# Patient Record
Sex: Female | Born: 1990
Health system: Southern US, Community
[De-identification: ages and names within clinical notes are randomized; demographics above are authoritative.]

## PROBLEM LIST (undated history)

## (undated) DIAGNOSIS — F32A Depression, unspecified: Secondary | ICD-10-CM

## (undated) DIAGNOSIS — I370 Nonrheumatic pulmonary valve stenosis: Secondary | ICD-10-CM

## (undated) DIAGNOSIS — F419 Anxiety disorder, unspecified: Secondary | ICD-10-CM

## (undated) HISTORY — DX: Nonrheumatic pulmonary valve stenosis: I37.0

## (undated) HISTORY — PX: CHOLECYSTECTOMY: SHX55

---

## 2017-01-18 ENCOUNTER — Ambulatory Visit (INDEPENDENT_AMBULATORY_CARE_PROVIDER_SITE_OTHER): Payer: BLUE CROSS/BLUE SHIELD | Admitting: Osteopathic Medicine

## 2017-01-18 ENCOUNTER — Encounter: Payer: Self-pay | Admitting: Osteopathic Medicine

## 2017-01-18 VITALS — BP 124/78 | HR 59 | Ht 66.0 in | Wt 207.0 lb

## 2017-01-18 DIAGNOSIS — F40241 Acrophobia: Secondary | ICD-10-CM | POA: Insufficient documentation

## 2017-01-18 DIAGNOSIS — Z3041 Encounter for surveillance of contraceptive pills: Secondary | ICD-10-CM | POA: Diagnosis not present

## 2017-01-18 DIAGNOSIS — I370 Nonrheumatic pulmonary valve stenosis: Secondary | ICD-10-CM | POA: Insufficient documentation

## 2017-01-18 DIAGNOSIS — Z113 Encounter for screening for infections with a predominantly sexual mode of transmission: Secondary | ICD-10-CM

## 2017-01-18 NOTE — Progress Notes (Signed)
HPI: Alisha Rogers is a 26 y.o. female  who presents to [facility] today, 01/18/17,  for chief complaint of:  Chief Complaint  Patient presents with  . 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 She is afraid of heights and had a panic attack at work, requesting accommodations  Skin lesions: several moles which had been bothering her, would like these checked out. Irritating around the bra strap areas.  Hx pulmonary valve stenosis - diagnosed in childhood, no cardiac problems or exercise intolerance as a result.   Would like routine STI screening and refill on birth control.     Past medical, surgical, social and family history reviewed: Patient Active Problem List   Diagnosis Date Noted  . Acrophobia 01/18/2017  . Surveillance for birth control, oral contraceptives 01/18/2017  . Pulmonary stenosis 01/18/2017   No past surgical history on file. Social History  Substance Use Topics  . Smoking status: Never Smoker  . Smokeless tobacco: Never Used  . Alcohol use Not on file   Family History  Problem Relation Age of Onset  . Diabetes Father   . Stroke Maternal Grandmother   . Heart attack Maternal Grandfather      Current medication list and allergy/intolerance information reviewed:   Current Outpatient Prescriptions  Medication Sig Dispense Refill  . drospirenone-ethinyl estradiol (YASMIN,ZARAH,SYEDA) 3-0.03 MG tablet Take 1 tablet by mouth daily.     No current facility-administered medications for this visit.    No Known Allergies    Review of Systems:  Constitutional:  No  fever, no chills, No recent illness, No unintentional weight changes. No significant fatigue.   HEENT: No  headache, no vision change, no hearing change, No sore throat, No  sinus pressure  Cardiac: No  chest pain, No  pressure, No palpitations, No   Orthopnea  Respiratory:  No  shortness of breath. No  Cough  Gastrointestinal: No  abdominal pain, No  nausea, No  vomiting,  No  blood in stool, No  diarrhea, No  constipation   Musculoskeletal: No new myalgia/arthralgia  Genitourinary: No  incontinence, No  abnormal genital bleeding, No abnormal genital discharge  Skin: No  Rash, +other wounds/concerning lesions  Hem/Onc: No  easy bruising/bleeding, No  abnormal lymph node  Endocrine: No cold intolerance,  No heat intolerance.  Neurologic: No  weakness, No  dizziness  Psychiatric: No  concerns with depression, No  concerns with anxiety, No sleep problems, No mood problems  Exam:  BP 124/78   Pulse (!) 59   Ht 5\' 6"  (1.676 m)   Wt 207 lb (93.9 kg)   BMI 33.41 kg/m   Constitutional: VS see above. General Appearance: alert, well-developed, well-nourished, NAD  Eyes: Normal lids and conjunctive, non-icteric sclera  Ears, Nose, Mouth, Throat: MMM, Normal external inspection ears/nares/mouth/lips/gums.  Neck: No masses, trachea midline. No thyroid enlargement. No tenderness/mass appreciated. No lymphadenopathy  Respiratory: Normal respiratory effort. no wheeze, no rhonchi, no rales  Cardiovascular: S1/S2 normal, no murmur, no rub/gallop auscultated. RRR. No lower extremity edema.   Musculoskeletal: Gait normal.  Neurological: Normal balance/coordination. No tremor.   Skin: warm, dry, intact. No rash/ulcer. No concerning nevi or subq nodules on limited exam though multiple benign appearing moles on trunk    Psychiatric: Normal judgment/insight. Normal mood and affect. Oriented x3.    ASSESSMENT/PLAN:   Acrophobia - paperwork completed  for work [employer] for birth control, oral contraceptives - Plan: CBC, COMPLETE METABOLIC PANEL WITH GFR  Routine screening for STI (sexually transmitted infection) - Plan: HIV antibody  Pulmonary valve stenosis, unspecified etiology     Visit summary with  medication list and pertinent instructions was printed for patient to review. All questions at time of visit were answered - patient instructed to contact office with any additional concerns. ER/RTC precautions were reviewed with the patient. Follow-up plan: Return for 1) skin tag removal; 2) annual wellness exam and Pap .  Note: Total time spent 30 minutes, greater than 50% of the visit was spent face-to-face counseling and coordinating care for the following: The primary encounter diagnosis was Acrophobia. Diagnoses of Surveillance for birth control, oral contraceptives, Routine screening for STI (sexually transmitted infection), and Pulmonary valve stenosis, unspecified etiology were also pertinent to this visit.Marland Kitchen

## 2017-01-19 LAB — CBC
HCT: 34.8 % — ABNORMAL LOW (ref 35.0–45.0)
HEMOGLOBIN: 11.8 g/dL (ref 11.7–15.5)
MCH: 28.2 pg (ref 27.0–33.0)
MCHC: 33.9 g/dL (ref 32.0–36.0)
MCV: 83.1 fL (ref 80.0–100.0)
MPV: 10.3 fL (ref 7.5–12.5)
PLATELETS: 266 10*3/uL (ref 140–400)
RBC: 4.19 10*6/uL (ref 3.80–5.10)
RDW: 12.6 % (ref 11.0–15.0)
WBC: 6.9 10*3/uL (ref 3.8–10.8)

## 2017-01-19 LAB — COMPLETE METABOLIC PANEL WITH GFR
AG RATIO: 1.3 (calc) (ref 1.0–2.5)
ALBUMIN MSPROF: 4 g/dL (ref 3.6–5.1)
ALT: 15 U/L (ref 6–29)
AST: 14 U/L (ref 10–30)
Alkaline phosphatase (APISO): 58 U/L (ref 33–115)
BUN / CREAT RATIO: 8 (calc) (ref 6–22)
BUN: 6 mg/dL — AB (ref 7–25)
CALCIUM: 9.4 mg/dL (ref 8.6–10.2)
CO2: 24 mmol/L (ref 20–32)
Chloride: 106 mmol/L (ref 98–110)
Creat: 0.75 mg/dL (ref 0.50–1.10)
GFR, EST AFRICAN AMERICAN: 127 mL/min/{1.73_m2} (ref >=60)
GFR, EST NON AFRICAN AMERICAN: 109 mL/min/{1.73_m2} (ref >=60)
GLUCOSE: 97 mg/dL (ref 65–99)
Globulin: 3 g/dL (calc) (ref 1.9–3.7)
Potassium: 4.4 mmol/L (ref 3.5–5.3)
Sodium: 139 mmol/L (ref 135–146)
Total Bilirubin: 0.4 mg/dL (ref 0.2–1.2)
Total Protein: 7 g/dL (ref 6.1–8.1)

## 2017-01-19 LAB — HIV ANTIBODY (ROUTINE TESTING W REFLEX): HIV 1&2 Ab, 4th Generation: NONREACTIVE

## 2017-01-25 ENCOUNTER — Encounter: Payer: Self-pay | Admitting: Osteopathic Medicine

## 2017-01-25 ENCOUNTER — Ambulatory Visit (INDEPENDENT_AMBULATORY_CARE_PROVIDER_SITE_OTHER): Payer: BLUE CROSS/BLUE SHIELD | Admitting: Osteopathic Medicine

## 2017-01-25 VITALS — BP 138/82 | HR 71 | Wt 204.0 lb

## 2017-01-25 DIAGNOSIS — D229 Melanocytic nevi, unspecified: Secondary | ICD-10-CM

## 2017-01-26 NOTE — Progress Notes (Signed)
HPI: Alisha Rogers is a 26 y.o. female  who presents to [facility] today, 01/26/17,  for chief complaint of:  Chief Complaint  Patient presents with  . SKIN TAG REMOVAL    Irritation of skin tags on back and left axilla. Especially at areas near the bra line, shirt collar. Here today for removal.  Past medical history, surgical history, social history and family history reviewed.  Patient Active Problem List   Diagnosis Date Noted  . Acrophobia 01/18/2017  . Surveillance for birth control, oral contraceptives 01/18/2017  . Pulmonary stenosis 01/18/2017    Current medication list and allergy/intolerance information reviewed.   Current Outpatient Prescriptions on File Prior to Visit  Medication Sig Dispense Refill  . drospirenone-ethinyl estradiol (YASMIN,ZARAH,SYEDA) 3-0.03 MG tablet Take 1 tablet by mouth daily.     No current facility-administered medications on file prior to visit.    No Known Allergies    Review of Systems:  Constitutional: No recent illness, feels well today   Exam:  BP 138/82   Pulse 71   Wt 204 lb (92.5 kg)   BMI 32.93 kg/m   Constitutional: VS see above. General Appearance: alert, well-developed, well-nourished, NAD  Skin: warm, dry, intact. Benign moles - multiple    PRE-OP DIAGNOSIS: Irritated benign-appearing Skin Lesions POST-OP DIAGNOSIS: Same  PROCEDURE: skin biopsy Performing Physician: Emeterio Reeve   PROCEDURE:  Shave Biopsy and skin tag removal - given fairly broad base of some benign nevi/skin tags opted for shave biopsy of 3 of the 4 lesions   The areas surrounding the skin lesions prepared in the usual sterile manner. Anesthesia obtained with about 2 mL total of lidocaine with epinephrine. Due to unpleasant burning sensation of the anesthesia, patient declined to have skin tags/moles removed in armpit area. Benign-appearing moles removed from base of neck, right shoulder 2, left  shoulder 1. The lesions was removed in the usual manner by the biopsy method noted above. Hemostasis was assured. The patient tolerated the procedure well.   Closure: Silver nitrate cautery for hemostasis   Followup: The patient tolerated the procedure well without complications.  Standard post-procedure care is explained and return precautions are given.    ASSESSMENT/PLAN:   [place] - Plan: Dermatology pathology, Dermatology pathology    Follow-up plan: No Follow-up on file.  Visit summary with medication list and pertinent instructions was printed for patient to review, alert [place] if any changes needed. All questions at time of visit were answered - patient instructed to contact office with any additional concerns. ER/RTC precautions were reviewed with the patient and understanding verbalized.

## 2017-02-01 ENCOUNTER — Ambulatory Visit (INDEPENDENT_AMBULATORY_CARE_PROVIDER_SITE_OTHER): Payer: BLUE CROSS/BLUE SHIELD | Admitting: Osteopathic Medicine

## 2017-02-01 ENCOUNTER — Other Ambulatory Visit (HOSPITAL_COMMUNITY)
Admission: RE | Admit: 2017-02-01 | Discharge: 2017-02-01 | Disposition: A | Discharge status: Home / Self Care | Payer: BLUE CROSS/BLUE SHIELD | Source: Ambulatory Visit | Attending: Family Medicine | Admitting: Family Medicine

## 2017-02-01 ENCOUNTER — Encounter: Payer: Self-pay | Admitting: Osteopathic Medicine

## 2017-02-01 VITALS — BP 127/80 | HR 76 | Ht 66.0 in | Wt 206.0 lb

## 2017-02-01 DIAGNOSIS — H6123 Impacted cerumen, bilateral: Secondary | ICD-10-CM

## 2017-02-01 DIAGNOSIS — Z0000 Encounter for general adult medical examination without abnormal findings: Secondary | ICD-10-CM | POA: Insufficient documentation

## 2017-02-01 DIAGNOSIS — Z113 Encounter for screening for infections with a predominantly sexual mode of transmission: Secondary | ICD-10-CM

## 2017-02-01 DIAGNOSIS — Z124 Encounter for screening for malignant neoplasm of cervix: Secondary | ICD-10-CM | POA: Diagnosis present

## 2017-02-01 DIAGNOSIS — F418 Other specified anxiety disorders: Secondary | ICD-10-CM

## 2017-02-01 MED ORDER — ALPRAZOLAM 0.5 MG PO TBDP: 0.5000 mg | ORAL_TABLET | Freq: Two times a day (BID) | ORAL | 0 refills | Status: DC | PRN

## 2017-02-01 NOTE — Patient Instructions (Signed)
Plan:  You should hear back about the results of your Pap smear within 1 week. Please call us if you do not hear anything back from [place] about this as well as the screening for gonorrhea/chlamydia.  If you decide you would like the other skin tags removed, please schedule an appointment.  If everything is going well, see me in one year for repeat annual physical or sooner if needed if any issues or questions arise!  Take care!

## 2017-02-01 NOTE — Progress Notes (Signed)
HPI: Alisha Rogers is a 26 y.o. female  who presents to [facility] today, 02/01/17,  for chief complaint of:  Chief Complaint  Patient presents with  . Annual Exam  . Gynecologic Exam      Patient here for annual physical / wellness exam.  See preventive care reviewed as below.  Recent labs reviewed in detail with the patient.   Additional concerns today include:   Fear of plane ride, requesting antianxiety medication for upcoming trip.  Earwax bothers her from time to time, requests that we could flush ears today if able  Past medical, surgical, social and family history reviewed: Patient Active Problem List   Diagnosis Date Noted  . Acrophobia 01/18/2017  . Surveillance for birth control, oral contraceptives 01/18/2017  . Pulmonary stenosis 01/18/2017   No past surgical history on file. Social History  Substance Use Topics  . Smoking status: Never Smoker  . Smokeless tobacco: Never Used  . Alcohol use Not on file   Family History  Problem Relation Age of Onset  . Diabetes Father   . Stroke Maternal Grandmother   . Heart attack Maternal Grandfather      Current medication list and allergy/intolerance information reviewed:   Current Outpatient Prescriptions  Medication Sig Dispense Refill  . drospirenone-ethinyl estradiol (YASMIN,ZARAH,SYEDA) 3-0.03 MG tablet Take 1 tablet by mouth daily.     No current facility-administered medications for this visit.    No Known Allergies    Review of Systems:  Constitutional:  No  fever, no chills, No recent illness, No unintentional weight changes. No significant fatigue.   HEENT: No  headache, no vision change, no hearing change, No sore throat, No  sinus pressure  Cardiac: No  chest pain, No  pressure, No palpitations, No  Orthopnea  Respiratory:  No  shortness of breath. No  Cough  Gastrointestinal: No  abdominal pain, No  nausea, No  vomiting,  No  blood in stool, No   diarrhea, No  constipation   Musculoskeletal: No new myalgia/arthralgia  Genitourinary: No  incontinence, No  abnormal genital bleeding, No abnormal genital discharge  Skin: No  Rash, No other wounds/concerning lesions  Hem/Onc: No  easy bruising/bleeding, No  abnormal lymph node  Endocrine: No cold intolerance,  No heat intolerance. No polyuria/polydipsia/polyphagia   Neurologic: No  weakness, No  dizziness, No  slurred speech/focal weakness/facial droop  Psychiatric: No  concerns with depression, No  concerns with anxiety, No sleep problems, No mood problems  Exam:  BP 127/80   Pulse 76   Ht 5\' 6"  (1.676 m)   Wt 206 lb (93.4 kg)   BMI 33.25 kg/m   Constitutional: VS see above. General Appearance: alert, well-developed, well-nourished, NAD  Eyes: Normal lids and conjunctive, non-icteric sclera  Ears, Nose, Mouth, Throat: MMM, Normal external inspection ears/nares/mouth/lips/gums. TM normal bilaterally. Pharynx/tonsils no erythema, no exudate. Nasal mucosa normal.   Neck: No masses, trachea midline. No thyroid enlargement. No tenderness/mass appreciated. No lymphadenopathy  Respiratory: Normal respiratory effort. no wheeze, no rhonchi, no rales  Cardiovascular: S1/S2 normal, no murmur, no rub/gallop auscultated. RRR. No lower extremity edema. Pedal pulse II/IV bilaterally DP and PT. No carotid bruit or JVD. No abdominal aortic bruit.  Gastrointestinal: Nontender, no masses. No hepatomegaly, no splenomegaly. No hernia appreciated. Bowel sounds normal. Rectal exam deferred.   Musculoskeletal: Gait normal. No clubbing/cyanosis of digits.   Neurological: Normal balance/coordination. No tremor. No cranial nerve deficit on limited exam. Motor and sensation intact  and symmetric. Cerebellar reflexes intact.   Skin: warm, dry, intact. No rash/ulcer. No concerning nevi or subq nodules on limited exam.    Psychiatric: Normal judgment/insight. Normal mood and affect. Oriented x3.   GYN: No lesions/ulcers to external genitalia, normal urethra, normal vaginal mucosa, physiologic discharge, cervix normal without lesions, uterus not enlarged or tender, adnexa no masses and nontender  BREAST: No rashes/skin changes, normal fibrous breast tissue, no masses or tenderness, normal nipple without discharge, normal axilla    ASSESSMENT/PLAN:   Annual physical exam - Plan: Cytology - PAP, C. trachomatis/N. gonorrhoeae RNA  Cervical cancer screening - Plan: Cytology - PAP  Situational anxiety - Plan: ALPRAZolam (NIRAVAM) 0.5 MG dissolvable tablet  Routine screening for STI (sexually transmitted infection) - Plan: C. trachomatis/N. gonorrhoeae RNA    FEMALE PREVENTIVE CARE Updated 02/01/17   ANNUAL SCREENING/COUNSELING  Diet/Exercise - HEALTHY HABITS DISCUSSED TO DECREASE CV RISK History  Smoking Status  . Never Smoker  Smokeless Tobacco  . Never Used   History  Alcohol use Not on file   Depression screen [facility] 2/9 01/18/2017  Decreased Interest 1  Down, Depressed, Hopeless 1  PHQ - 2 Score 2  Altered sleeping 0  Tired, decreased energy 0  Change in appetite 0  Feeling bad or failure about yourself  0  Trouble concentrating 0  Moving slowly or fidgety/restless 0  Suicidal thoughts 0  PHQ-9 Score 2  Difficult doing work/chores Not difficult at all    Domestic violence concerns - no  HTN SCREENING - SEE [place]  Sexually active in the past year - Yes with female.  Need/want STI testing today? - no  Concerns about libido or pain with sex? - no  Plans for pregnancy? - none at the moment   INFECTIOUS DISEASE SCREENING  HIV - needs  GC/CT - needs  HepC - DOB [birth year] - does not need  TB - does not need  DISEASE SCREENING  Lipid - does not need  DM2 - does not need  Osteoporosis - women age 42+ - does not need  CANCER SCREENING  Cervical - needs  Breast - does not need  Lung - does not need  Colon - does not  need  ADULT VACCINATION  Influenza - annual vaccine recommended  Td - booster every 10 years   Zoster - option at 33, yes at 60+   PCV13 - was not indicated  PPSV23 - was not indicated  There is no immunization history on file for this patient.    Patient Instructions  Plan:  You should hear back about the results of your Pap smear within 1 week. Please call us if you do not hear anything back from [place] about this as well as the screening for gonorrhea/chlamydia.  If you decide you would like the other skin tags removed, please schedule an appointment.  If everything is going well, see me in one year for repeat annual physical or sooner if needed if any issues or questions arise!  Take care!     Visit summary with medication list and pertinent instructions was printed for patient to review. All questions at time of visit were answered - patient instructed to contact office with any additional concerns. ER/RTC precautions were reviewed with the patient. Follow-up plan: Return in about 1 year (around 02/01/2018) for [place], SOONER IF NEEDED .

## 2017-02-02 LAB — C. TRACHOMATIS/N. GONORRHOEAE RNA
C. TRACHOMATIS RNA, TMA: NOT DETECTED
N. gonorrhoeae RNA, TMA: NOT DETECTED

## 2017-02-08 LAB — CYTOLOGY - PAP

## 2017-03-27 ENCOUNTER — Encounter: Payer: Self-pay | Admitting: Osteopathic Medicine

## 2017-03-27 ENCOUNTER — Ambulatory Visit (INDEPENDENT_AMBULATORY_CARE_PROVIDER_SITE_OTHER): Payer: BLUE CROSS/BLUE SHIELD | Admitting: Osteopathic Medicine

## 2017-03-27 VITALS — BP 137/96 | HR 70 | Temp 98.1°F | Resp 16 | Wt 209.3 lb

## 2017-03-27 DIAGNOSIS — F40241 Acrophobia: Secondary | ICD-10-CM | POA: Diagnosis not present

## 2017-03-27 DIAGNOSIS — F418 Other specified anxiety disorders: Secondary | ICD-10-CM

## 2017-03-27 DIAGNOSIS — F321 Major depressive disorder, single episode, moderate: Secondary | ICD-10-CM

## 2017-03-27 MED ORDER — FLUOXETINE HCL 10 MG PO TABS: 10.0000 mg | ORAL_TABLET | Freq: Every day | ORAL | 1 refills | Status: DC

## 2017-03-27 MED ORDER — ALPRAZOLAM 0.5 MG PO TBDP: 0.5000 mg | ORAL_TABLET | Freq: Two times a day (BID) | ORAL | 0 refills | Status: DC | PRN

## 2017-03-27 NOTE — Progress Notes (Signed)
HPI: Alisha Rogers is a 26 y.o. female who  has no past medical history on file.  she presents to [facility] today, 03/27/17,  for chief complaint of:  Chief Complaint  Patient presents with  . Advice Only    Depression  . Referral    Pyschiatry    Struggling with depression/anxiety. Alprazolam was helping a lot for situational issues. There have been a lot of problems at work. She would like to talk about starting medications, she thinks she has struggled with depression/anxiety for a long time but has been reluctant to try medications for it. She is open to counseling as well.    Past medical, surgical, social and family history reviewed:  Patient Active Problem List   Diagnosis Date Noted  . Acrophobia 01/18/2017  . Surveillance for birth control, oral contraceptives 01/18/2017  . Pulmonary stenosis 01/18/2017    No past surgical history on file.  Social History   Tobacco Use  . Smoking status: Never Smoker  . Smokeless tobacco: Never Used  Substance Use Topics  . Alcohol use: Yes    Comment: average 1-2 per week     Family History  Problem Relation Age of Onset  . Hypertension Father   . Stroke Maternal Grandmother   . Diabetes Maternal Grandmother   . Heart attack Maternal Grandfather      Current medication list and allergy/intolerance information reviewed:    Current Outpatient Medications  Medication Sig Dispense Refill  . ALPRAZolam (NIRAVAM) 0.5 MG dissolvable tablet Take 1-2 tablets (0.5-1 mg total) by mouth 2 (two) times daily as needed for anxiety (pre-flight, etc). 20 tablet 0  . drospirenone-ethinyl estradiol (YASMIN,ZARAH,SYEDA) 3-0.03 MG tablet Take 1 tablet by mouth daily.     No current facility-administered medications for this visit.     No Known Allergies    Review of Systems:  Constitutional:  No  fever, no chills, No recent illness  HEENT: No  headache  Cardiac: No  chest pain, No  pressure,  No palpitations,  Respiratory:  No  shortness of breath. No  Cough  Psychiatric: +concerns with depression, +concerns with anxiety, No sleep problems, No mood problems  Exam:  BP (!) 137/96 (BP Location: Right Arm, Patient Position: Sitting, Cuff Size: Large) Comment: due to rushing here  Pulse 70   Temp 98.1 F (36.7 C) (Oral)   Resp 16   Wt 209 lb 4.8 oz (94.9 kg)   LMP 03/05/2017 (Approximate)   BMI 33.78 kg/m   Constitutional: VS see above. General Appearance: alert, well-developed, well-nourished, NAD  Eyes: Normal lids and conjunctive, non-icteric sclera  Ears, Nose, Mouth, Throat: MMM, Normal external inspection ears/nares/mouth/lips/gums.   Neck: No masses, trachea midline. No thyroid enlargement.  Respiratory: Normal respiratory effort. no wheeze, no rhonchi, no rales  Cardiovascular: S1/S2 normal, no murmur, no rub/gallop auscultated. RRR.  PT. No carotid bruit or JVD. No abdominal aortic bruit.    Psychiatric: Normal judgment/insight. Normal mood and affect. Oriented x3.      ASSESSMENT/PLAN:   Current moderate episode of major depressive disorder without prior episode ([place]) - Plan: FLUoxetine (PROZAC) 10 MG tablet, Ambulatory referral to [place] anxiety - Plan: Ambulatory referral to [place], ALPRAZolam (NIRAVAM) 0.5 MG dissolvable tablet  Acrophobia - Plan: Ambulatory referral to [facility] [facility]    Patient Instructions  We are starting a medication today called Prozac to help treat your depression/anxiety. This is a daily medication to help control your symptoms.  I also highly encourage my patients who are suffering from depression/anxiety to seek care with a counselor or therapist. A therapist can coach you in techniques to recognize and deal with troubling thought patterns and behaviors. The ability to cope with external stressors is crucial to overall mental health. I have placed a referral to behavioral health for  counseling/therapy. Please let us know if you don't hear back about that referral.   Expect a call or message form this office in the next 2 weeks to check in: If you're doing well on the medicine but not feeling any effect, we can increase the dose. If you're starting to feel some effect/improvement, we can hold off on a dose increase and reevaluate at your office visit.   Let's plan to follow up in the office in 4 weeks. At that time, we can talk about how well the medicine is working for you, and we can consider increasing the dose, adding another medicine, etc.   If we are having trouble finding a good medication regimen for you, we can consult with a psychiatrist to assist with medication management.   If you experience problematic side effects, please let me know ASAP - we can switch the medicine any time, and we don't need an appointment for this.    Any questions or concerns, call me!      Visit summary with medication list and pertinent instructions was printed for patient to review. All questions at time of visit were answered - patient instructed to contact office with any additional concerns. ER/RTC precautions were reviewed with the patient. Follow-up plan: Return in about 4 weeks (around 04/24/2017) for recheck on new medicines, sooner if needed .  Note: Total time spent 25 minutes, greater than 50% of the visit was spent face-to-face counseling and coordinating care for the following: The primary encounter diagnosis was Current moderate episode of major depressive disorder without prior episode ([place]). Diagnoses of Situational anxiety and Acrophobia were also pertinent to this visit.Marland Kitchen  Please note: voice recognition software was used to produce this document, and typos may escape review. Please contact Dr. Sheppard Coil for any needed clarifications.

## 2017-03-27 NOTE — Patient Instructions (Addendum)
We are starting a medication today called Prozac to help treat your depression/anxiety. This is a daily medication to help control your symptoms.   I also highly encourage my patients who are suffering from depression/anxiety to seek care with a counselor or therapist. A therapist can coach you in techniques to recognize and deal with troubling thought patterns and behaviors. The ability to cope with external stressors is crucial to overall mental health. I have placed a referral to behavioral health for counseling/therapy. Please let us know if you don't hear back about that referral.   Expect a call or message form this office in the next 2 weeks to check in: If you're doing well on the medicine but not feeling any effect, we can increase the dose. If you're starting to feel some effect/improvement, we can hold off on a dose increase and reevaluate at your office visit.   Let's plan to follow up in the office in 4 weeks. At that time, we can talk about how well the medicine is working for you, and we can consider increasing the dose, adding another medicine, etc.   If we are having trouble finding a good medication regimen for you, we can consult with a psychiatrist to assist with medication management.   If you experience problematic side effects, please let me know ASAP - we can switch the medicine any time, and we don't need an appointment for this.    Any questions or concerns, call me!

## 2017-03-28 ENCOUNTER — Encounter: Payer: Self-pay | Admitting: Osteopathic Medicine

## 2017-04-10 ENCOUNTER — Telehealth: Payer: Self-pay | Admitting: Osteopathic Medicine

## 2017-04-10 NOTE — Telephone Encounter (Signed)
-----   Message from Emeterio Reeve, DO sent at 03/27/2017  3:37 PM EST ----- Regarding: depression  Call - we started prozac 10

## 2017-04-10 NOTE — Telephone Encounter (Signed)
Please call patient: We started fluoxetine 10 mg daily, 2 weeks ago. How is she doing on this medicine?  If ok, can either stay at this dose or can increase to 20 mg (2 tablets daily) and we can discuss further at follow-up. ANy questions/problems, let me know

## 2017-04-11 NOTE — Telephone Encounter (Signed)
Lmom for patient to call back 

## 2017-04-12 ENCOUNTER — Telehealth: Payer: Self-pay | Admitting: Osteopathic Medicine

## 2017-04-12 NOTE — Telephone Encounter (Signed)
-----   Message from Emeterio Reeve, DO sent at 03/27/2017  3:37 PM EST ----- Regarding: depression  Call - we started prozac 10

## 2017-04-12 NOTE — Telephone Encounter (Signed)
Error

## 2017-04-17 NOTE — Telephone Encounter (Signed)
Left message for a return call

## 2017-04-26 ENCOUNTER — Ambulatory Visit: Payer: BLUE CROSS/BLUE SHIELD | Admitting: Osteopathic Medicine

## 2017-04-26 ENCOUNTER — Encounter: Payer: Self-pay | Admitting: Osteopathic Medicine

## 2017-04-26 DIAGNOSIS — F321 Major depressive disorder, single episode, moderate: Secondary | ICD-10-CM

## 2017-04-26 MED ORDER — ALPRAZOLAM 0.5 MG PO TABS: 0.5000 mg | ORAL_TABLET | Freq: Two times a day (BID) | ORAL | 0 refills | Status: DC | PRN

## 2017-04-26 MED ORDER — FLUOXETINE HCL 20 MG PO TABS: 20.0000 mg | ORAL_TABLET | Freq: Every day | ORAL | 1 refills | Status: DC

## 2017-04-26 NOTE — Progress Notes (Signed)
HPI: Alisha Rogers is a 26 y.o. female who  has no past medical history on file.  she presents to [facility] today, 04/26/17,  for chief complaint of:  Chief Complaint  Patient presents with  . Follow-up after starting Prozac    Initial visit for Depression/Anxiety: 03/27/17 "Struggling with depression/anxiety. Alprazolam was helping a lot for situational issues. There have been a lot of problems at work. She would like to talk about starting medications, she thinks she has struggled with depression/anxiety for a long time but has been reluctant to try medications for it. She is open to counseling as well." We started Prozac 10 mg and refilled Xanax 0.5 mg (my mistake - dissolvable tablets sent) and sent referral to behavioral health for counseling.   Phone call from [place] 2 weeks ago to check up on her on the medications - left voicemail x2. She members getting the voicemail but she is admittedly bad at calling us back  Second visit 04/26/17 today  No concerns today. She feels she is doing a little bit better overall mood wise on the Prozac and is okay to go up on the dose at this point. Requests 90 day supply as she will be losing her insurance in January.      Past medical, surgical, social and family history reviewed: No updates needed   Current medication list and allergy/intolerance information reviewed:  No updates needed     Review of Systems:  Constitutional:  No  fever, no chills, No recent illness  HEENT: No  headache  Cardiac: No  chest pain, No  pressure, No palpitations,  Respiratory:  No  shortness of breath. No  Cough  Psychiatric: +concerns with depression, +concerns with anxiety, No sleep problems, No mood problems  Exam:  BP 124/83 (BP Location: Right Arm)   Pulse 97   Wt 202 lb 11.2 oz (91.9 kg)   BMI 32.72 kg/m   Constitutional: VS see above. General Appearance: alert, well-developed, well-nourished, NAD  Eyes:  Normal lids and conjunctive, non-icteric sclera  Ears, Nose, Mouth, Throat: MMM, Normal external inspection ears/nares/mouth/lips/gums.   Neck: No masses, trachea midline. No thyroid enlargement.  Respiratory: Normal respiratory effort. no wheeze, no rhonchi, no rales  Cardiovascular: S1/S2 normal, no murmur, no rub/gallop auscultated. RRR.  PT. No carotid bruit or JVD. No abdominal aortic bruit.    Psychiatric: Normal judgment/insight. Normal mood and affect. Oriented x3.      ASSESSMENT/PLAN: The encounter diagnosis was Current moderate episode of major depressive disorder without prior episode ([place]).  Meds ordered this encounter  Medications  . FLUoxetine (PROZAC) 20 MG tablet    Sig: Take 1 tablet (20 mg total) by mouth daily.    Dispense:  90 tablet    Refill:  1    Cancel 10 mg dose  . ALPRAZolam (XANAX) 0.5 MG tablet    Sig: Take 1 tablet (0.5 mg total) by mouth 2 (two) times daily as needed for anxiety.    Dispense:  30 tablet    Refill:  0 - dissolvable tabs ordered last visit -pt hated taste of these, advised can turn in extras to pharmacy and sent alternative.     Visit summary with medication list and pertinent instructions was printed for patient to review. All questions at time of visit were answered - patient instructed to contact office with any additional concerns. ER/RTC precautions were reviewed with the patient.   Follow-up plan: Return for recheck 4-6 weeks after  increasing Prozac if needed , if ok on the Rx, will refill .  Note: Total time spent 15 minutes, greater than 50% of the visit was spent face-to-face counseling and coordinating care for the following: The encounter diagnosis was Current moderate episode of major depressive disorder without prior episode ([place])..  Please note: voice recognition software was used to produce this document, and typos may escape review. Please contact Dr. Sheppard Coil for any needed clarifications.

## 2017-04-27 DIAGNOSIS — F321 Major depressive disorder, single episode, moderate: Secondary | ICD-10-CM | POA: Insufficient documentation

## 2017-05-01 LAB — BASIC METABOLIC PANEL
BUN: 6 (ref 4–21)
Creatinine: 0.8 (ref 0.5–1.1)
Potassium: 4.2 (ref 3.4–5.3)
SODIUM: 139 (ref 137–147)

## 2017-05-01 LAB — HEPATIC FUNCTION PANEL
ALK PHOS: 106 (ref 25–125)
ALT: 115 — AB (ref 7–35)
AST: 222 — AB (ref 13–35)
BILIRUBIN, TOTAL: 0.8

## 2017-05-01 LAB — CBC AND DIFFERENTIAL
Hemoglobin: 12.4 (ref 12.0–16.0)
Platelets: 277 (ref 150–399)
WBC: 8.9

## 2017-05-01 LAB — HEMOGLOBIN A1C: Hemoglobin A1C: 12.4

## 2017-05-09 ENCOUNTER — Encounter: Payer: Self-pay | Admitting: Osteopathic Medicine

## 2017-05-09 ENCOUNTER — Ambulatory Visit: Payer: BLUE CROSS/BLUE SHIELD | Admitting: Osteopathic Medicine

## 2017-05-09 VITALS — BP 124/71 | HR 80 | Temp 98.0°F | Wt 203.1 lb

## 2017-05-09 DIAGNOSIS — K828 Other specified diseases of gallbladder: Secondary | ICD-10-CM

## 2017-05-09 DIAGNOSIS — K8020 Calculus of gallbladder without cholecystitis without obstruction: Secondary | ICD-10-CM | POA: Diagnosis not present

## 2017-05-09 DIAGNOSIS — R748 Abnormal levels of other serum enzymes: Secondary | ICD-10-CM

## 2017-05-09 LAB — COMPLETE METABOLIC PANEL WITH GFR
AG Ratio: 1.4 (calc) (ref 1.0–2.5)
ALBUMIN MSPROF: 4.1 g/dL (ref 3.6–5.1)
ALKALINE PHOSPHATASE (APISO): 97 U/L (ref 33–115)
ALT: 31 U/L — AB (ref 6–29)
AST: 15 U/L (ref 10–30)
BILIRUBIN TOTAL: 0.3 mg/dL (ref 0.2–1.2)
BUN: 9 mg/dL (ref 7–25)
CHLORIDE: 105 mmol/L (ref 98–110)
CO2: 23 mmol/L (ref 20–32)
CREATININE: 0.89 mg/dL (ref 0.50–1.10)
Calcium: 9.3 mg/dL (ref 8.6–10.2)
GFR, Est African American: 103 mL/min/{1.73_m2} (ref >=60)
GFR, Est Non African American: 89 mL/min/{1.73_m2} (ref >=60)
GLUCOSE: 101 mg/dL — AB (ref 65–99)
Globulin: 3 g/dL (calc) (ref 1.9–3.7)
Potassium: 4.2 mmol/L (ref 3.5–5.3)
Sodium: 137 mmol/L (ref 135–146)
Total Protein: 7.1 g/dL (ref 6.1–8.1)

## 2017-05-09 LAB — GAMMA GT: GGT: 133 U/L — AB (ref 3–40)

## 2017-05-09 NOTE — Progress Notes (Signed)
HPI: Alisha Rogers is a 26 y.o. female who  has no past medical history on file.  she presents to [facility] today, 05/09/17,  for chief complaint of:  Chief Complaint  Patient presents with  . Follow-up- hospital visit in [place] for gallbladder issue    Severe cramping RUQ abdominal pain just prior to and on plan ride to visit to [place]. Hospital visit there (+) elevated liver enzymes, (+)US GB stones and sludge, HIDA negative for obstruction. Pain has since resolved. She brings records of results, no discharge summary   Patient is accompanied by mom who assists with history-taking.   Past medical, surgical, social and family history reviewed:  Patient Active Problem List   Diagnosis Date Noted  . Current moderate episode of major depressive disorder without prior episode ([place]) 04/27/2017  . Acrophobia 01/18/2017  . Surveillance for birth control, oral contraceptives 01/18/2017  . Pulmonary stenosis 01/18/2017    No past surgical history on file.  Social History   Tobacco Use  . Smoking status: Never Smoker  . Smokeless tobacco: Never Used  Substance Use Topics  . Alcohol use: Yes    Comment: average 1-2 per week     Family History  Problem Relation Age of Onset  . Hypertension Father   . Stroke Maternal Grandmother   . Diabetes Maternal Grandmother   . Heart attack Maternal Grandfather      Current medication list and allergy/intolerance information reviewed:    Current Outpatient Medications  Medication Sig Dispense Refill  . ALPRAZolam (XANAX) 0.5 MG tablet Take 1 tablet (0.5 mg total) by mouth 2 (two) times daily as needed for anxiety. 30 tablet 0  . drospirenone-ethinyl estradiol (YASMIN,ZARAH,SYEDA) 3-0.03 MG tablet Take 1 tablet by mouth daily.    Marland Kitchen FLUoxetine (PROZAC) 20 MG tablet Take 1 tablet (20 mg total) by mouth daily. 90 tablet 1   No current facility-administered medications for this visit.     No Known  Allergies    Review of Systems:  Constitutional:  No  fever, no chills, No unintentional weight changes. No significant fatigue  Cardiac: No  chest pain, No  pressure  Respiratory:  No  shortness of breath. No  Cough  Gastrointestinal: No  abdominal pain, No  nausea, No  vomiting,  No  blood in stool, No  diarrhea, No  constipation   Musculoskeletal: No new myalgia/arthralgia  Skin: No  Rash  Neurologic: No  weakness, No  dizziness   Exam:  BP 124/71   Pulse 80   Temp 98 F (36.7 C)   Wt 203 lb 1.3 oz (92.1 kg)   BMI 32.78 kg/m   Constitutional: VS see above. General Appearance: alert, well-developed, well-nourished, NAD  Eyes: Normal lids and conjunctive, non-icteric sclera  Ears, Nose, Mouth, Throat: MMM, Normal external inspection ears/nares/mouth/lips/gums.   Neck: No masses, trachea midline.  Respiratory: Normal respiratory effort. no wheeze, no rhonchi, no rales  Cardiovascular: S1/S2 normal, no murmur, no rub/gallop auscultated. RRR. No lower extremity edema.   Gastrointestinal: Nontender, no masses. No hepatomegaly, no splenomegaly. No hernia appreciated. Bowel sounds normal. Rectal exam deferred.   Musculoskeletal: Gait normal.  Neurological: Normal balance/coordination. No tremor.   Skin: warm, dry, intact.     Psychiatric: Normal judgment/insight. Normal mood and affect. Oriented x3.    Results for orders placed or performed in visit on 05/09/17 (from the past 72 hour(s))  COMPLETE METABOLIC PANEL WITH GFR     Status: Abnormal  Collection Time: 05/09/17 11:45 AM  Result Value Ref Range   Glucose, Bld 101 (H) 65 - 99 mg/dL    Comment: .            Fasting reference interval . For someone without known diabetes, a glucose value between 100 and 125 mg/dL is consistent with prediabetes and should be confirmed with a follow-up test. .    BUN 9 7 - 25 mg/dL   Creat 0.89 0.50 - 1.10 mg/dL   GFR, Est Non African American 89 > OR = 60  mL/min/1.22m2   GFR, Est African American 103 > OR = 60 mL/min/1.92m2   BUN/Creatinine Ratio NOT APPLICABLE 6 - 22 (calc)   Sodium 137 135 - 146 mmol/L   Potassium 4.2 3.5 - 5.3 mmol/L   Chloride 105 98 - 110 mmol/L   CO2 23 20 - 32 mmol/L   Calcium 9.3 8.6 - 10.2 mg/dL   Total Protein 7.1 6.1 - 8.1 g/dL   Albumin 4.1 3.6 - 5.1 g/dL   Globulin 3.0 1.9 - 3.7 g/dL (calc)   AG Ratio 1.4 1.0 - 2.5 (calc)   Total Bilirubin 0.3 0.2 - 1.2 mg/dL   Alkaline phosphatase (APISO) 97 33 - 115 U/L   AST 15 10 - 30 U/L   ALT 31 (H) 6 - 29 U/L  Gamma GT     Status: Abnormal   Collection Time: 05/09/17 11:45 AM  Result Value Ref Range   GGT 133 (H) 3 - 40 U/L      ASSESSMENT/PLAN:   Calculus of gallbladder without cholecystitis without obstruction - liver labs trending down, pain resolved. ?passing stone? Advised can refer to [facility] for discussion of elective removal, pt opts for watchful waiting which I think is also reasonable given complete resolution of symptoms and normal exam today - Plan: COMPLETE METABOLIC PANEL WITH GFR, Gamma GT  Elevated liver enzymes - Plan: COMPLETE METABOLIC PANEL WITH GFR, Gamma GT  Gallbladder sludge - Plan: COMPLETE METABOLIC PANEL WITH GFR, Gamma GT     Visit summary with medication list and pertinent instructions was printed for patient to review. All questions at time of visit were answered - patient instructed to contact office with any additional concerns. ER/RTC precautions were reviewed with the patient.   Follow-up plan: Return if symptoms worsen or fail to improve.  Note: Total time spent 25 minutes, greater than 50% of the visit was spent face-to-face counseling and coordinating care for the following: The primary encounter diagnosis was Calculus of gallbladder without cholecystitis without obstruction. Diagnoses of Elevated liver enzymes and Gallbladder sludge were also pertinent to this visit.Marland Kitchen  Please note: voice recognition software was used to  produce this document, and typos may escape review. Please contact Dr. Sheppard Coil for any needed clarifications.

## 2017-05-14 ENCOUNTER — Ambulatory Visit (HOSPITAL_COMMUNITY): Payer: BLUE CROSS/BLUE SHIELD | Admitting: Licensed Clinical Social Worker

## 2017-05-18 ENCOUNTER — Encounter: Payer: Self-pay | Admitting: Osteopathic Medicine

## 2017-05-18 LAB — LIPASE: LIPASE: 27

## 2017-05-22 ENCOUNTER — Other Ambulatory Visit: Payer: Self-pay | Admitting: Osteopathic Medicine

## 2017-05-22 DIAGNOSIS — F321 Major depressive disorder, single episode, moderate: Secondary | ICD-10-CM

## 2017-09-24 ENCOUNTER — Encounter: Payer: Self-pay | Admitting: Osteopathic Medicine

## 2017-09-24 DIAGNOSIS — Z021 Encounter for pre-employment examination: Secondary | ICD-10-CM

## 2017-09-26 NOTE — Progress Notes (Signed)
Referring-Natalie Alexander, DO Reason for referral-pulmonic stenosis/DOT physical  HPI: 27 year old female for pulmonic stenosis/DOT physical at request of Emeterio Reeve, DO.  Patient states she was diagnosed with pulmonic stenosis in [place] as a child.  She had echocardiograms but never required intervention.  She has not seen a cardiologist in 10 years.  She now presents for clearance for DOT purposes.  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  She denies dyspnea, chest pain, palpitations or syncope.  Current Outpatient Medications  Medication Sig Dispense Refill  . ALPRAZolam (XANAX) 0.5 MG tablet Take 1 tablet (0.5 mg total) by mouth 2 (two) times daily as needed for anxiety. 30 tablet 0  . drospirenone-ethinyl estradiol (YASMIN,ZARAH,SYEDA) 3-0.03 MG tablet Take 1 tablet by mouth daily.    Marland Kitchen FLUoxetine (PROZAC) 20 MG tablet Take 1 tablet (20 mg total) by mouth daily. 90 tablet 1   No current facility-administered medications for this visit.     No Known Allergies   Past Medical History:  Diagnosis Date  . Pulmonic stenosis     History reviewed. No pertinent surgical history.  Social History   Socioeconomic History  . Marital status: Single    Spouse name: Not on file  . Number of children: Not on file  . Years of education: Not on file  . Highest education level: Not on file  Occupational History  . Not on file  Social Needs  . Financial resource strain: Not on file  . Food insecurity:    Worry: Not on file    Inability: Not on file  . Transportation needs:    Medical: Not on file    Non-medical: Not on file  Tobacco Use  . Smoking status: Never Smoker  . Smokeless tobacco: Never Used  Substance and Sexual Activity  . Alcohol use: Yes    Comment: average 1-2 per week   . Drug use: No  . Sexual activity: Yes    Birth control/protection: Pill  Lifestyle  . Physical activity:    Days per week: Not on  file    Minutes per session: Not on file  . Stress: Not on file  Relationships  . Social connections:    Talks on phone: Not on file    Gets together: Not on file    Attends religious service: Not on file    Active member of club or organization: Not on file    Attends meetings of clubs or organizations: Not on file    Relationship status: Not on file  . Intimate partner violence:    Fear of current or ex partner: Not on file    Emotionally abused: Not on file    Physically abused: Not on file    Forced sexual activity: Not on file  Other Topics Concern  . Not on file  Social History Narrative  . Not on file    Family History  Problem Relation Age of Onset  . Hypertension Father   . Stroke Maternal Grandmother   . Diabetes Maternal Grandmother   . Heart attack Maternal Grandfather     ROS: no fevers or chills, productive cough, hemoptysis, dysphasia, odynophagia, melena, hematochezia, dysuria, hematuria, rash, seizure activity, orthopnea, PND, pedal edema, claudication. Remaining systems are negative.  Physical Exam:   Blood pressure (!) 142/78, pulse 67, height 5\' 6"  (1.676 m), weight 213 lb 12.8 oz (97 kg).  General:  Well developed/well nourished in NAD Skin warm/dry  Patient not depressed No peripheral clubbing Back-normal HEENT-normal/normal eyelids Neck supple/normal carotid upstroke bilaterally; no bruits; no JVD; no thyromegaly chest - CTA/ normal expansion CV - RRR/normal S1 and S2; no rubs or gallops;  PMI nondisplaced, 1/6 systolic murmur left sternal border. Abdomen -NT/ND, no HSM, no mass, + bowel sounds, no bruit 2+ femoral pulses, no bruits Ext-no edema, chords, 2+ DP Neuro-grossly nonfocal  ECG -normal sinus rhythm at a rate of 67.  No ST changes.  Personally reviewed  A/P  1 pulmonic stenosis-I will arrange a transthoracic echocardiogram to further assess.  If pulmonic stenosis is mild she may proceed without further cardiac evaluation.  She will  need follow-up in [place] for her pulmonic stenosis.  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  Kirk Ruths, MD

## 2017-10-02 ENCOUNTER — Encounter: Payer: Self-pay | Admitting: Osteopathic Medicine

## 2017-10-02 ENCOUNTER — Ambulatory Visit: Payer: BLUE CROSS/BLUE SHIELD | Admitting: Osteopathic Medicine

## 2017-10-02 VITALS — BP 129/65 | HR 67 | Temp 98.0°F | Wt 214.2 lb

## 2017-10-02 DIAGNOSIS — R87612 Low grade squamous intraepithelial lesion on cytologic smear of cervix (LGSIL): Secondary | ICD-10-CM | POA: Diagnosis not present

## 2017-10-02 DIAGNOSIS — Z23 Encounter for immunization: Secondary | ICD-10-CM | POA: Diagnosis not present

## 2017-10-02 DIAGNOSIS — IMO0001 Reserved for inherently not codable concepts without codable children: Secondary | ICD-10-CM

## 2017-10-02 DIAGNOSIS — N926 Irregular menstruation, unspecified: Secondary | ICD-10-CM

## 2017-10-02 DIAGNOSIS — Z598 Other problems related to housing and economic circumstances: Secondary | ICD-10-CM

## 2017-10-02 NOTE — Patient Instructions (Signed)
Over-the-Counter Medications & Home Remedies for Upper Respiratory Illness  Note: the following list assumes no pregnancy, normal liver & kidney function and no other drug interactions. Dr. Sheppard Coil has highlighted medications which are safe for you to use, but these may not be appropriate for everyone. Always ask a pharmacist or qualified medical provider if you have any questions!   Aches/Pains, Fever, Headache Acetaminophen (Tylenol) 500 mg tablets - take max 2 tablets (1000 mg) every 6 hours (4 times per day)  Ibuprofen (Motrin) 200 mg tablets - take max 4 tablets (800 mg) every 6 hours*  Sinus Congestion Prescription Atrovent as directed Cromolyn Nasal Spray (NasalCrom) 1 spray each nostril 3-4 times per day, max 6 imes per day Nasal Saline if desired Oxymetolazone (Afrin, others) sparing use due to rebound congestion, NEVER use in kids Phenylephrine (Sudafed) 10 mg tablets every 4 hours (or the 12-hour formulation)* Diphenhydramine (Benadryl) 25 mg tablets - take max 2 tablets every 4 hours  Cough & Sore Throat Prescription cough pills or syrups as directed Dextromethorphan (Robitussin, others) - cough suppressant Guaifenesin (Robitussin, Mucinex, others) - expectorant (helps cough up mucus) (Dextromethorphan and Guaifenesin also come in a combination tablet) Lozenges w/ Benzocaine + Menthol (Cepacol) Honey - as much as you want! Teas which "coat the throat" - look for ingredients Elm Bark, Licorice Root, Marshmallow Root  Other Antibiotics if these are prescribed - take ALL, even if you're feeling better  Zinc Lozenges within 24 hours of symptoms onset - mixed evidence this shortens the duration of the common cold Don't waste your money on Vitamin C or Echinacea  *Caution in patients with high blood pressure

## 2017-10-02 NOTE — Progress Notes (Signed)
HPI: Alisha Rogers is a 27 y.o. female who  has no past medical history on file.  she presents to [facility] today, 10/02/17,  for chief complaint of: Tdap booster Moving to [place], needs records   Alisha Rogers has moved to [place], has yet to establish care there. Here today visiting family and going back to [place] tomorrow. She requests tetanus booster today  History of a currently reports recent heavy vaginal bleeding intermenstrual, has tapered off to a spot now, compliant with birth control pills. Has not set her self up with OB/GYN!       Past medical history, surgical history, and family history reviewed.  Current medication list and allergy/intolerance information reviewed.   (See remainder of HPI, ROS, Phys Exam below)  Immunization History  Administered Date(s) Administered  . Tdap 10/02/2017     ASSESSMENT/PLAN:   Moving to new residence - pertinent lab records and imaging rep Advised she needs to set up with PCP and/or OB/GYN in new home! States no need for refills at this time. Good luck!   Low grade squamous intraepithelial lesion on cytologic smear of cervix (LGSIL)  Irregular menstrual bleeding - likely OCP adveerse effect but couldn't say for sure, declines pelvic exam today   Need for diphtheria-tetanus-pertussis (Tdap) vaccine - Plan: Tdap vaccine greater than or equal to 7yo IM        Follow-up plan: Return for recheck as needed .     ############################################ ############################################ ############################################ ############################################    Outpatient Encounter Medications as of 10/02/2017  Medication Sig  . ALPRAZolam (XANAX) 0.5 MG tablet Take 1 tablet (0.5 mg total) by mouth 2 (two) times daily as needed for anxiety.  . drospirenone-ethinyl estradiol (YASMIN,ZARAH,SYEDA) 3-0.03 MG tablet Take 1 tablet by mouth daily.  Marland Kitchen  FLUoxetine (PROZAC) 20 MG tablet Take 1 tablet (20 mg total) by mouth daily.   No facility-administered encounter medications on file as of 10/02/2017.    No Known Allergies    Review of Systems:  Constitutional: No recent illness  HEENT: No  headache, no vision change  Cardiac: No  chest pain, No  pressure, No palpitations  Respiratory:  No  shortness of breath. No  Cough  Gastrointestinal: No  abdominal pain  Musculoskeletal: No new myalgia/arthralgia  Skin: No  Rash  Hem/Onc: No  easy bruising/bleeding, No  abnormal lumps/bumps  Neurologic: No  weakness, No  Dizziness  Psychiatric: No  concerns with depression, No  concerns with anxiety  Exam:  BP 129/65 (BP Location: Left Arm, Patient Position: Sitting, Cuff Size: Large)   Pulse 67   Temp 98 F (36.7 C) (Oral)   Wt 214 lb 3.2 oz (97.2 kg)   BMI 34.57 kg/m   Constitutional: VS see above. General Appearance: alert, well-developed, well-nourished, NAD  Eyes: Normal lids and conjunctive, non-icteric sclera  Ears, Nose, Mouth, Throat: MMM, Normal external inspection ears/nares/mouth/lips/gums.  Neck: No masses, trachea midline.   Respiratory: Normal respiratory effort.   Musculoskeletal: Gait normal. Symmetric and independent movement of all extremities  Neurological: Normal balance/coordination. No tremor.  Skin: warm, dry, intact.   Psychiatric: Normal judgment/insight. Normal mood and affect. Oriented x3.   Visit summary with medication list and pertinent instructions was printed for patient to review, advised to alert [place] if any changes needed. All questions at time of visit were answered - patient instructed to contact office with any additional concerns. ER/RTC precautions were reviewed with the patient and understanding verbalized.   Follow-up plan:  Return for recheck as needed .  Note: Total time spent 25 minutes, greater than 50% of the visit was spent face-to-face counseling and coordinating care for  the following: The primary encounter diagnosis was Moving to new residence. Diagnoses of Low grade squamous intraepithelial lesion on cytologic smear of cervix (LGSIL), Irregular menstrual bleeding, and Need for diphtheria-tetanus-pertussis (Tdap) vaccine were also pertinent to this visit.Marland Kitchen  Please note: voice recognition software was used to produce this document, and typos may escape review. Please contact Dr. Sheppard Coil for any needed clarifications.

## 2017-10-03 ENCOUNTER — Ambulatory Visit (INDEPENDENT_AMBULATORY_CARE_PROVIDER_SITE_OTHER): Payer: BLUE CROSS/BLUE SHIELD | Admitting: Cardiology

## 2017-10-03 ENCOUNTER — Other Ambulatory Visit: Payer: Self-pay | Admitting: *Deleted

## 2017-10-03 ENCOUNTER — Encounter: Payer: Self-pay | Admitting: Cardiology

## 2017-10-03 ENCOUNTER — Telehealth: Payer: Self-pay | Admitting: Cardiology

## 2017-10-03 ENCOUNTER — Encounter: Payer: Self-pay | Admitting: Osteopathic Medicine

## 2017-10-03 VITALS — BP 142/78 | HR 67 | Ht 66.0 in | Wt 213.8 lb

## 2017-10-03 DIAGNOSIS — I370 Nonrheumatic pulmonary valve stenosis: Secondary | ICD-10-CM

## 2017-10-03 NOTE — Telephone Encounter (Signed)
Requested information faxed to the number provided.

## 2017-10-03 NOTE — Telephone Encounter (Signed)
New message   [facility] in  [place] calling [phone] In order for ECHO to be done, they are in need of copy of insurance card and demographic facesheet. Please fax [phone] ATTN: Janett Billow

## 2017-10-03 NOTE — Patient Instructions (Signed)
Medication Instructions:   NO CHANGE  Testing/Procedures:  Your physician has requested that you have an echocardiogram. Echocardiography is a painless test that uses sound waves to create images of your heart. It provides your doctor with information about the size and shape of your heart and how well your heart's chambers and valves are working. This procedure takes approximately one hour. There are no restrictions for this procedure.    Follow-Up:  Your physician recommends that you schedule a follow-up appointment in: AS NEEDED      

## 2017-10-03 NOTE — Telephone Encounter (Signed)
Janett Billow at [facility] called needing Dr. Jacalyn Lefevre NPI and medical license number.  Please send first thing on 10/04/17. Phone number [phone]

## 2017-10-03 NOTE — Telephone Encounter (Signed)
Pt's mom calling   Stated pt will have her Echo done in [place] and an order need to be faxed to. Didn't know the name of the facility. Mom said it must be done asap so pt can get the test done for a job clearance.  Fax [phone] Phone # [phone]

## 2017-10-03 NOTE — Telephone Encounter (Signed)
Spoke with pt, aware order has been faxed and confirmation received.

## 2017-10-04 NOTE — Telephone Encounter (Signed)
For you -

## 2017-10-04 NOTE — Telephone Encounter (Signed)
Left message for Alisha Rogers to call

## 2017-10-05 NOTE — Telephone Encounter (Signed)
Spoke with [facility], NPI and license number given. All other questions answered.

## 2017-10-10 ENCOUNTER — Encounter: Payer: Self-pay | Admitting: Cardiology

## 2017-10-10 ENCOUNTER — Encounter: Payer: Self-pay | Admitting: *Deleted

## 2017-10-24 ENCOUNTER — Other Ambulatory Visit: Payer: Self-pay | Admitting: Osteopathic Medicine

## 2017-10-24 DIAGNOSIS — F321 Major depressive disorder, single episode, moderate: Secondary | ICD-10-CM

## 2017-10-25 ENCOUNTER — Encounter: Payer: Self-pay | Admitting: Osteopathic Medicine

## 2017-11-25 ENCOUNTER — Encounter: Payer: Self-pay | Admitting: Osteopathic Medicine

## 2017-11-25 DIAGNOSIS — R748 Abnormal levels of other serum enzymes: Secondary | ICD-10-CM

## 2017-11-25 DIAGNOSIS — K828 Other specified diseases of gallbladder: Secondary | ICD-10-CM

## 2017-11-25 DIAGNOSIS — K8020 Calculus of gallbladder without cholecystitis without obstruction: Secondary | ICD-10-CM

## 2017-11-26 NOTE — Addendum Note (Signed)
Addended by: Huel Cote on: 11/26/2017 02:40 PM   Modules accepted: Orders

## 2018-04-24 ENCOUNTER — Other Ambulatory Visit: Payer: Self-pay | Admitting: Osteopathic Medicine

## 2018-04-24 DIAGNOSIS — F321 Major depressive disorder, single episode, moderate: Secondary | ICD-10-CM

## 2019-02-25 ENCOUNTER — Other Ambulatory Visit: Payer: Self-pay | Admitting: Osteopathic Medicine

## 2019-02-25 DIAGNOSIS — F321 Major depressive disorder, single episode, moderate: Secondary | ICD-10-CM

## 2019-02-25 NOTE — Telephone Encounter (Signed)
Requested medication (s) are due for refill today: yes  Requested medication (s) are on the active medication list: yes  Last refill:  01/30/2019  Future visit scheduled: no  Notes to clinic:  Review for refill Overdue for office visit   Requested Prescriptions  Pending Prescriptions Disp Refills   FLUoxetine (PROZAC) 20 MG tablet [Pharmacy Med Name: FLUOXETINE HCL 20 MG TABLET] 30 tablet 0    Sig: Take 1 tablet by mouth daily     Psychiatry:  Antidepressants - SSRI Failed - 02/25/2019  2:57 PM      Failed - Valid encounter within last 6 months    Recent Outpatient Visits          1 year ago Moving to new residence   [facility], [place], DO   1 year ago Calculus of gallbladder without cholecystitis without obstruction   [facility], Lanelle Bal, DO   1 year ago Current moderate episode of major depressive disorder without prior episode [facility])   [place] [facility], Lanelle Bal, DO   1 year ago Current moderate episode of major depressive disorder without prior episode [facility])   [place], [place], DO   2 years ago Annual physical exam   [facility], [place], DO             Passed - Completed PHQ-2 or PHQ-9 in the last 360 days.

## 2019-03-31 ENCOUNTER — Other Ambulatory Visit: Payer: Self-pay | Admitting: Osteopathic Medicine

## 2019-03-31 DIAGNOSIS — F321 Major depressive disorder, single episode, moderate: Secondary | ICD-10-CM

## 2019-03-31 NOTE — Telephone Encounter (Signed)
Requested medication (s) are due for refill today: yes  Requested medication (s) are on the active medication list: yes  Last refill:  01/30/2019  Future visit scheduled:no  Notes to clinic: overdue for office visit  Review for refill   Requested Prescriptions  Pending Prescriptions Disp Refills   FLUoxetine (PROZAC) 20 MG tablet [Pharmacy Med Name: FLUOXETINE HCL 20 MG TABLET] 30 tablet 0    Sig: Take 1 tablet by mouth daily     Psychiatry:  Antidepressants - SSRI Failed - 03/31/2019  2:10 PM      Failed - Valid encounter within last 6 months    Recent Outpatient Visits          1 year ago Moving to new residence   [facility], [place], DO   1 year ago Calculus of gallbladder without cholecystitis without obstruction   [place] [facility], Lanelle Bal, DO   1 year ago Current moderate episode of major depressive disorder without prior episode [facility])   [place] [facility], [place] Bal, DO   2 years ago Current moderate episode of major depressive disorder without prior episode [facility])   [facility] [place], [place], DO   2 years ago Annual physical exam   [facility], [place], DO             Passed - Completed PHQ-2 or PHQ-9 in the last 360 days.

## 2019-04-02 ENCOUNTER — Encounter: Payer: Self-pay | Admitting: Osteopathic Medicine

## 2019-04-02 DIAGNOSIS — F321 Major depressive disorder, single episode, moderate: Secondary | ICD-10-CM

## 2019-04-02 MED ORDER — FLUOXETINE HCL 20 MG PO TABS: 20.0000 mg | ORAL_TABLET | Freq: Every day | ORAL | 1 refills | Status: DC

## 2019-04-02 NOTE — Telephone Encounter (Signed)
I have printed the prescription and waiting for PCP signature. PCP has signed the printed prescription and it has been faxed to the number listed in message.

## 2019-08-06 ENCOUNTER — Telehealth (INDEPENDENT_AMBULATORY_CARE_PROVIDER_SITE_OTHER): Payer: Self-pay | Admitting: Osteopathic Medicine

## 2019-08-06 DIAGNOSIS — Z91199 Patient's noncompliance with other medical treatment and regimen due to unspecified reason: Secondary | ICD-10-CM

## 2019-08-06 DIAGNOSIS — Z5329 Procedure and treatment not carried out because of patient's decision for other reasons: Secondary | ICD-10-CM

## 2019-08-06 NOTE — Progress Notes (Signed)
LVM needs to reschedule or go to UC/ER if feels she needs to be seen urgently.

## 2019-08-06 NOTE — Progress Notes (Signed)
Called pt at 1120 am and 1 pm. No answer, left multiple vm msgs.

## 2019-09-16 ENCOUNTER — Other Ambulatory Visit: Payer: Self-pay | Admitting: Osteopathic Medicine

## 2019-09-16 DIAGNOSIS — F321 Major depressive disorder, single episode, moderate: Secondary | ICD-10-CM

## 2019-09-19 ENCOUNTER — Other Ambulatory Visit: Payer: Self-pay

## 2019-11-05 ENCOUNTER — Emergency Department (INDEPENDENT_AMBULATORY_CARE_PROVIDER_SITE_OTHER): Payer: 59

## 2019-11-05 ENCOUNTER — Emergency Department
Admission: EM | Admit: 2019-11-05 | Discharge: 2019-11-05 | Disposition: A | Discharge status: Home / Self Care | Payer: 59 | Source: Home / Self Care | Attending: Family Medicine | Admitting: Family Medicine

## 2019-11-05 ENCOUNTER — Other Ambulatory Visit: Payer: Self-pay

## 2019-11-05 DIAGNOSIS — R1030 Lower abdominal pain, unspecified: Secondary | ICD-10-CM

## 2019-11-05 DIAGNOSIS — R110 Nausea: Secondary | ICD-10-CM | POA: Diagnosis not present

## 2019-11-05 DIAGNOSIS — R1031 Right lower quadrant pain: Secondary | ICD-10-CM

## 2019-11-05 HISTORY — DX: Anxiety disorder, unspecified: F41.9

## 2019-11-05 HISTORY — DX: Nonrheumatic pulmonary valve stenosis: I37.0

## 2019-11-05 HISTORY — DX: Depression, unspecified: F32.A

## 2019-11-05 LAB — POCT URINALYSIS DIP (MANUAL ENTRY)
Bilirubin, UA: NEGATIVE
Glucose, UA: NEGATIVE mg/dL
Ketones, POC UA: NEGATIVE mg/dL
Nitrite, UA: NEGATIVE
Protein Ur, POC: NEGATIVE mg/dL
Spec Grav, UA: 1.025 (ref 1.010–1.025)
Urobilinogen, UA: 0.2 E.U./dL
pH, UA: 6 (ref 5.0–8.0)

## 2019-11-05 LAB — POCT CBC W AUTO DIFF (K'VILLE URGENT CARE)

## 2019-11-05 LAB — POCT URINE PREGNANCY: Preg Test, Ur: NEGATIVE

## 2019-11-05 MED ORDER — IOHEXOL 300 MG/ML  SOLN
100.0000 mL | Freq: Once | INTRAMUSCULAR | Status: AC | PRN
Start: 2019-11-05 — End: 2019-11-05
  Administered 2019-11-05: 100 mL via INTRAVENOUS

## 2019-11-05 NOTE — Discharge Instructions (Addendum)
May take Ibuprofen 200mg , 4 tabs every 8 hours with food.  May take Tylenol as needed for pain.  Try applying ice pack for 20 to 30 minutes, 3 to 4 times daily  Continue until pain and swelling decrease.   If symptoms become significantly worse during the night or over the weekend, proceed to the local emergency room.

## 2019-11-05 NOTE — ED Provider Notes (Addendum)
[name]    CSN: [id number] Arrival date & time: 11/05/19  1059      History   Chief Complaint Chief Complaint  Patient presents with  . Abdominal Pain  . Nausea    HPI Alisha Rogers is a 29 y.o. female.   Four days ago after eating dinner, patient developed vague lower abdominal pain and sensation of bloating.  The discomfort has persisted and gradually migrated to her right lower quadrant.  The pain becomes worse after eating and does not radiate.  She has had nausea without vomiting.  She denies changes in her bowel movements.  Her symptoms have not improved after taking Tums.  No LMP recorded. (Menstrual status: Oral contraceptives); however she has had slight vaginal spotting during the past two days which she states is not unusual for her.  She denies urinary symptoms. Past surgical history of cholecystectomy.    The history is provided by the patient.  Abdominal Pain Pain location:  RLQ Pain quality: aching   Pain radiates to:  Does not radiate Pain severity:  Moderate Onset quality:  Gradual Duration:  4 days Timing:  Constant Progression:  Worsening Chronicity:  New Context: eating and previous surgery   Context: not alcohol use, not awakening from sleep, not diet changes, not recent illness, not recent travel, not sick contacts, not suspicious food intake and not trauma   Relieved by:  Nothing Worsened by:  Eating Ineffective treatments: Tums. Associated symptoms: anorexia and nausea   Associated symptoms: no belching, no chest pain, no chills, no constipation, no cough, no diarrhea, no dysuria, no fatigue, no fever, no flatus, no hematemesis, no hematochezia, no hematuria, no melena, no shortness of breath, no sore throat, no vaginal bleeding, no vaginal discharge and no vomiting     Past Medical History:  Diagnosis Date  . Anxiety   . Depression   . Pulmonary stenosis   . Pulmonic stenosis     Patient Active Problem List   Diagnosis  Date Noted  . Current moderate episode of major depressive disorder without prior episode ([place]) 04/27/2017  . Acrophobia 01/18/2017  . Surveillance for birth control, oral contraceptives 01/18/2017  . Pulmonary stenosis 01/18/2017    Past Surgical History:  Procedure Laterality Date  . CHOLECYSTECTOMY      OB History   No obstetric history on file.      Home Medications    Prior to Admission medications   Medication Sig Start Date End Date Taking? Authorizing Provider  ALPRAZolam Duanne Moron) 0.5 MG tablet Take 1 tablet (0.5 mg total) by mouth 2 (two) times daily as needed for anxiety. 04/26/17   Emeterio Reeve, DO  drospirenone-ethinyl estradiol (YASMIN,ZARAH,SYEDA) 3-0.03 MG tablet Take 1 tablet by mouth daily.    [provider]  FLUoxetine (PROZAC) 20 MG tablet Take 1 tablet by mouth daily 09/17/19   Emeterio Reeve, DO    Family History Family History  Problem Relation Age of Onset  . Hypertension Father   . Parkinson's disease Father   . Stroke Maternal Grandmother   . Diabetes Maternal Grandmother   . Heart attack Maternal Grandfather   . Diabetes Maternal Grandfather     Social History Social History   Tobacco Use  . Smoking status: Never Smoker  . Smokeless tobacco: Never Used  Vaping Use  . Vaping Use: Never used  Substance Use Topics  . Alcohol use: Yes    Comment: average 1-2 per week   . Drug use: No  Allergies   Patient has no known allergies.   Review of Systems Review of Systems  Constitutional: Positive for appetite change. Negative for activity change, chills, diaphoresis, fatigue and fever.  HENT: Negative for sore throat.   Respiratory: Negative for cough and shortness of breath.   Cardiovascular: Negative for chest pain.  Gastrointestinal: Positive for abdominal pain, anorexia and nausea. Negative for constipation, diarrhea, flatus, hematemesis, hematochezia, melena and vomiting.  Genitourinary: Negative for dysuria,  hematuria, vaginal bleeding and vaginal discharge.  All other systems reviewed and are negative.    Physical Exam Triage Vital Signs ED Triage Vitals  Enc Vitals Group     BP 11/05/19 1105 (!) 157/94     Pulse Rate 11/05/19 1105 98     Resp 11/05/19 1105 20     Temp 11/05/19 1105 98.8 F (37.1 C)     Temp Source 11/05/19 1105 Oral     SpO2 11/05/19 1105 99 %     Weight 11/05/19 1107 263 lb (119.3 kg)     Height 11/05/19 1107 5\' 7"  (1.702 m)     Head Circumference --      Peak Flow --      Pain Score 11/05/19 1107 4     Pain Loc --      Pain Edu? --      Excl. in [place]? --    No data found.  Updated Vital Signs BP (!) 157/94 (BP Location: Right Arm)   Pulse 98   Temp 98.8 F (37.1 C) (Oral)   Resp 20   Ht 5\' 7"  (1.702 m)   Wt 119.3 kg   SpO2 99%   BMI 41.19 kg/m   Visual Acuity Right Eye Distance:   Left Eye Distance:   Bilateral Distance:    Right Eye Near:   Left Eye Near:    Bilateral Near:     Physical Exam Vitals and nursing note reviewed.  Constitutional:      General: She is not in acute distress.    Appearance: She is obese.  HENT:     Head: Normocephalic.     Right Ear: External ear normal.     Left Ear: External ear normal.     Nose: Nose normal.     Mouth/Throat:     Mouth: Mucous membranes are moist.     Pharynx: Oropharynx is clear.  Eyes:     Conjunctiva/sclera: Conjunctivae normal.     Pupils: Pupils are equal, round, and reactive to light.  Cardiovascular:     Heart sounds: Normal heart sounds.  Pulmonary:     Breath sounds: Normal breath sounds.  Abdominal:     General: Bowel sounds are normal. There is no distension.     Palpations: Abdomen is soft. There is no hepatomegaly or splenomegaly.     Tenderness: There is abdominal tenderness in the right lower quadrant. There is no right CVA tenderness, left CVA tenderness or rebound.     Hernia: There is no hernia in the umbilical area, left inguinal area or right inguinal area.        Comments: Tenderness right lower quadrant pain as noted on diagram.   Musculoskeletal:     Cervical back: Neck supple.     Right lower leg: No edema.     Left lower leg: No edema.  Lymphadenopathy:     Cervical: No cervical adenopathy.  Skin:    General: Skin is warm and dry.     Findings: No rash.  Neurological:     Mental Status: She is alert and oriented to person, place, and time.      UC Treatments / Results  Labs (all labs ordered are listed, but only abnormal results are displayed) Labs Reviewed  POCT URINALYSIS DIP (MANUAL ENTRY) - Abnormal; Notable for the following components:      Result Value   Blood, UA large (*)    Leukocytes, UA Trace (*)    All other components within normal limits  URINE CULTURE  POCT URINE PREGNANCY negative  POCT CBC W AUTO DIFF ([facility]):  WBC 10.4; LY 29.5; MO 5.2; GR 65.3; Hgb 12.1; Platelets 316     EKG   Radiology CT ABDOMEN PELVIS W CONTRAST  Result Date: 11/05/2019 CLINICAL DATA:  Right lower quadrant tenderness and nausea since 11/02/2019. EXAM: CT ABDOMEN AND PELVIS WITH CONTRAST TECHNIQUE: Multidetector CT imaging of the abdomen and pelvis was performed using the standard protocol following bolus administration of intravenous contrast. CONTRAST:  100 mL OMNIPAQUE IOHEXOL 300 MG/ML  SOLN COMPARISON:  None. FINDINGS: Lower chest: Lung bases clear.  No pleural or pericardial effusion. Hepatobiliary: No focal liver abnormality is seen. Status post cholecystectomy. No biliary dilatation. Dropped surgical clip between the right hepatic lobe and right kidney incidentally noted. Pancreas: Unremarkable. No pancreatic ductal dilatation or surrounding inflammatory changes. Spleen: Normal in size without focal abnormality. Adrenals/Urinary Tract: Adrenal glands are unremarkable. Kidneys are normal, without renal calculi, focal lesion, or hydronephrosis. Bladder is unremarkable. Stomach/Bowel: Stomach is within normal limits. Appendix  appears normal. No evidence of bowel wall thickening, distention, or inflammatory changes. Vascular/Lymphatic: No significant vascular findings are present. No enlarged abdominal or pelvic lymph nodes. Reproductive: Uterus and bilateral adnexa are unremarkable. Other: Small fat containing umbilical hernia noted. Musculoskeletal: Negative. IMPRESSION: Negative for appendicitis. No acute abnormality or finding to explain the patient's symptoms. Small fat containing umbilical hernia. Status post cholecystectomy. Electronically Signed   By: Inge Rise M.D.   On: 11/05/2019 14:17    Procedures Procedures (including critical care time)  Medications Ordered in UC Medications - No data to display  Initial Impression / Assessment and Plan / UC Course  I have reviewed the triage vital signs and the nursing notes.  Pertinent labs & imaging results that were available during my care of the patient were reviewed by me and considered in my medical decision making (see chart for details).    Normal CBC and negative CT abdomen/pelvis reassuring.  ?musculoskeletal pain. Note trace leuks on urinalysis without urinary symptoms; urine culture pending.  Note large blood on urinalysis; could be secondary to her vaginal spotting. Followup with Family Doctor in two days.   Final Clinical Impressions(s) / UC Diagnoses   Final diagnoses:  RLQ abdominal pain     Discharge Instructions     May take Ibuprofen 200mg , 4 tabs every 8 hours with food.  May take Tylenol as needed for pain.  Try applying ice pack for 20 to 30 minutes, 3 to 4 times daily  Continue until pain and swelling decrease.   If symptoms become significantly worse during the night or over the weekend, proceed to the local emergency room.     ED Prescriptions    None        Kandra Nicolas, MD 11/09/19 [id number]    Kandra Nicolas, MD 11/09/19 [phone]

## 2019-11-05 NOTE — ED Triage Notes (Signed)
Has had nausea and abd pain x 3 days.  Denies vomiting.  Has taken tums, but has not helped.  Mostly lower right.  Has had gall bladder removed.

## 2019-11-06 LAB — URINE CULTURE
MICRO NUMBER:: 10652468
Result:: NO GROWTH
SPECIMEN QUALITY:: ADEQUATE

## 2019-11-07 ENCOUNTER — Other Ambulatory Visit: Payer: Self-pay

## 2019-11-07 ENCOUNTER — Encounter: Payer: Self-pay | Admitting: Medical-Surgical

## 2019-11-07 ENCOUNTER — Ambulatory Visit (INDEPENDENT_AMBULATORY_CARE_PROVIDER_SITE_OTHER): Payer: 59 | Admitting: Medical-Surgical

## 2019-11-07 VITALS — BP 133/91 | HR 79 | Temp 98.2°F | Ht 66.0 in | Wt 256.2 lb

## 2019-11-07 DIAGNOSIS — R1031 Right lower quadrant pain: Secondary | ICD-10-CM | POA: Diagnosis not present

## 2019-11-07 NOTE — Progress Notes (Signed)
Subjective:    CC: Urgent care follow-up for RLQ abdominal pain  HPI: Very pleasant 29 year old female presenting today for follow-up from an urgent care visit on 11/05/2019 for right lower quadrant abdominal pain at the time of her visit she was experiencing significant nausea in addition to the abdominal pain.  Labs were drawn and an abdominal CT was performed.  There is no evidence at the time of appendicitis or need for urgent intervention.  She notes that her pain continued through the day yesterday.  Yesterday she took ibuprofen/Motrin and notes that the pain has completely resolved.  She is no longer nauseous.  She does wonder if her pain could have been related to endometriosis as she noticed that she began to have vaginal spotting about the time her pain started.  She has never had evaluation for endometriosis but is interested in pursuing that.  She has no fever, chills, GI symptoms, urinary symptoms, or vaginal odor/discharge.  I reviewed the past medical history, family history, social history, surgical history, and allergies today and no changes were needed.  Please see the problem list section below in epic for further details.  Past Medical History: Past Medical History:  Diagnosis Date  . Anxiety   . Depression   . Pulmonary stenosis   . Pulmonic stenosis    Past Surgical History: Past Surgical History:  Procedure Laterality Date  . CHOLECYSTECTOMY     Social History: Social History   Socioeconomic History  . Marital status: Single    Spouse name: Not on file  . Number of children: Not on file  . Years of education: Not on file  . Highest education level: Not on file  Occupational History  . Not on file  Tobacco Use  . Smoking status: Never Smoker  . Smokeless tobacco: Never Used  Vaping Use  . Vaping Use: Never used  Substance and Sexual Activity  . Alcohol use: Yes    Comment: average 1-2 per week   . Drug use: No  . Sexual activity: Yes    Birth  control/protection: Pill  Other Topics Concern  . Not on file  Social History Narrative  . Not on file   Social Determinants of Health   Financial Resource Strain:   . Difficulty of Paying Living Expenses:   Food Insecurity:   . Worried About Charity fundraiser in the Last Year:   . Arboriculturist in the Last Year:   Transportation Needs:   . Film/video editor (Medical):   Marland Kitchen Lack of Transportation (Non-Medical):   Physical Activity:   . Days of Exercise per Week:   . Minutes of Exercise per Session:   Stress:   . Feeling of Stress :   Social Connections:   . Frequency of Communication with Friends and Family:   . Frequency of Social Gatherings with Friends and Family:   . Attends Religious Services:   . Active Member of Clubs or Organizations:   . Attends Archivist Meetings:   Marland Kitchen Marital Status:    Family History: Family History  Problem Relation Age of Onset  . Hypertension Father   . Parkinson's disease Father   . Stroke Maternal Grandmother   . Diabetes Maternal Grandmother   . Heart attack Maternal Grandfather   . Diabetes Maternal Grandfather    Allergies: No Known Allergies Medications: See med rec.  Review of Systems: No fevers, chills, night sweats, weight loss, chest pain, or shortness of breath.  Objective:    General: Well Developed, well nourished, and in no acute distress.  Neuro: Alert and oriented x3.  HEENT: Normocephalic, atraumatic.  Skin: Warm and dry. Cardiac: Regular rate and rhythm, no murmurs rubs or gallops, no lower extremity edema.  Respiratory: Clear to auscultation bilaterally. Not using accessory muscles, speaking in full sentences. Abdomen: Soft, nondistended, nontender.  Bowel sounds positive x4 quadrants.  No HSM appreciated.   Impression and Recommendations:    1. Abdominal pain, RLQ (right lower quadrant) Resolved. Discussed the possibility of a kidney stone that passed due the the large amount of blood in  the UA.  Recommend establishing with an OB/GYN for evaluation of endometriosis.  Patient will let us know if she needs a referral for this.  Advised patient to return for evaluation if she experiences the symptoms again.  Patient verbalized understanding and is agreeable to the plan.  Return if symptoms worsen or fail to improve. ___________________________________________ Clearnce Sorrel, DNP, APRN, FNP-BC [facility] [place]

## 2020-05-16 ENCOUNTER — Emergency Department: Admit: 2020-05-16 | Discharge status: Absent without leave | Payer: Self-pay

## 2020-05-16 ENCOUNTER — Emergency Department (INDEPENDENT_AMBULATORY_CARE_PROVIDER_SITE_OTHER)
Admission: EM | Admit: 2020-05-16 | Discharge: 2020-05-16 | Disposition: A | Discharge status: Home / Self Care | Payer: 59 | Source: Home / Self Care

## 2020-05-16 ENCOUNTER — Other Ambulatory Visit: Payer: Self-pay

## 2020-05-16 DIAGNOSIS — J069 Acute upper respiratory infection, unspecified: Secondary | ICD-10-CM

## 2020-05-16 MED ORDER — BENZONATATE 100 MG PO CAPS
100.0000 mg | ORAL_CAPSULE | Freq: Three times a day (TID) | ORAL | 0 refills | Status: AC
Start: 2020-05-16 — End: ?

## 2020-05-16 MED ORDER — IPRATROPIUM BROMIDE 0.06 % NA SOLN
2.0000 | Freq: Four times a day (QID) | NASAL | 1 refills | Status: AC
Start: 2020-05-16 — End: ?

## 2020-05-16 NOTE — ED Triage Notes (Signed)
Patient presents to Urgent Care with complaints of cough, sore throat, otalgia, and sneezing since 4 days ago. Patient reports she took a home covid test and it was negative, has been vaccinated for covid, did not get her flu shot this seaason.

## 2020-05-16 NOTE — ED Provider Notes (Signed)
[facility]    CSN: [id number] Arrival date & time: 05/16/20  1424      History   Chief Complaint Chief Complaint  Patient presents with  . Cough  . Sore Throat    HPI Alisha Rogers is a 30 y.o. female.   HPI  Alisha Rogers is a 30 y.o. female presenting to UC with c/o cough, sore throat, ear pain and sneezing for 4 days.  She took a at-home COVID test, which was negative.  She was vaccinated for COVID at the beginning of 2021 but has not had the booster. She has not had her flu vaccine this season. No known sick contacts. Denies fever, chills, n/v/d.    Past Medical History:  Diagnosis Date  . Anxiety   . Depression   . Pulmonary stenosis   . Pulmonic stenosis     Patient Active Problem List   Diagnosis Date Noted  . Current moderate episode of major depressive disorder without prior episode ([facility]) 04/27/2017  . Acrophobia 01/18/2017  . Surveillance for birth control, oral contraceptives 01/18/2017  . Pulmonary stenosis 01/18/2017    Past Surgical History:  Procedure Laterality Date  . CHOLECYSTECTOMY      OB History   No obstetric history on file.      Home Medications    Prior to Admission medications   Medication Sig Start Date End Date Taking? Authorizing Provider  benzonatate (TESSALON) 100 MG capsule Take 1 capsule (100 mg total) by mouth every 8 (eight) hours. 05/16/20  Yes Isyss Espinal O, PA-C  ipratropium (ATROVENT) 0.06 % nasal spray Place 2 sprays into both nostrils 4 (four) times daily. 05/16/20  Yes Jerline Linzy, Vangie BickerErin O, PA-C  drospirenone-ethinyl estradiol (YASMIN,ZARAH,SYEDA) 3-0.03 MG tablet Take 1 tablet by mouth daily.    [provider]  FLUoxetine (PROZAC) 20 MG tablet Take 1 tablet by mouth daily 09/17/19   Sunnie NielsenAlexander, Natalie, DO    Family History Family History  Problem Relation Age of Onset  . Hypertension Father   . Parkinson's disease Father   . Stroke Maternal Grandmother   . Diabetes Maternal Grandmother   .  Heart attack Maternal Grandfather   . Diabetes Maternal Grandfather     Social History Social History   Tobacco Use  . Smoking status: Never Smoker  . Smokeless tobacco: Never Used  Vaping Use  . Vaping Use: Never used  Substance Use Topics  . Alcohol use: Yes    Comment: average 1-2 per week   . Drug use: No     Allergies   Patient has no known allergies.   Review of Systems Review of Systems  Constitutional: Negative for chills and fever.  HENT: Positive for congestion, ear pain, sneezing and sore throat. Negative for trouble swallowing and voice change.   Respiratory: Positive for cough. Negative for shortness of breath.   Cardiovascular: Negative for chest pain and palpitations.  Gastrointestinal: Negative for abdominal pain, diarrhea, nausea and vomiting.  Musculoskeletal: Negative for arthralgias, back pain and myalgias.  Skin: Negative for rash.  Neurological: Negative for dizziness, light-headedness and headaches.  All other systems reviewed and are negative.    Physical Exam Triage Vital Signs ED Triage Vitals [05/16/20 1544]  Enc Vitals Group     BP (!) 146/89     Pulse Rate 99     Resp 16     Temp 97.7 F (36.5 C)     Temp Source Oral     SpO2 99 %  Weight      Height      Head Circumference      Peak Flow      Pain Score 4     Pain Loc      Pain Edu?      Excl. in [place]?    No data found.  Updated Vital Signs BP (!) 146/89 (BP Location: Right Arm)   Pulse 99   Temp 97.7 F (36.5 C) (Oral)   Resp 16   SpO2 99%   Visual Acuity Right Eye Distance:   Left Eye Distance:   Bilateral Distance:    Right Eye Near:   Left Eye Near:    Bilateral Near:     Physical Exam Vitals and nursing note reviewed.  Constitutional:      Appearance: She is well-developed and well-nourished.  HENT:     Head: Normocephalic and atraumatic.     Right Ear: Tympanic membrane and ear canal normal.     Left Ear: Tympanic membrane and ear canal normal.      Nose: Nose normal.     Right Sinus: No maxillary sinus tenderness or frontal sinus tenderness.     Left Sinus: No maxillary sinus tenderness or frontal sinus tenderness.     Mouth/Throat:     Lips: Pink.     Mouth: Mucous membranes are moist.     Pharynx: Oropharynx is clear. Uvula midline. No pharyngeal swelling, oropharyngeal exudate, posterior oropharyngeal erythema or uvula swelling.  Eyes:     Extraocular Movements: EOM normal.  Cardiovascular:     Rate and Rhythm: Normal rate and regular rhythm.  Pulmonary:     Effort: Pulmonary effort is normal. No respiratory distress.     Breath sounds: Normal breath sounds. No stridor. No wheezing, rhonchi or rales.  Musculoskeletal:        General: Normal range of motion.     Cervical back: Normal range of motion and neck supple.  Lymphadenopathy:     Cervical: No cervical adenopathy.  Skin:    General: Skin is warm and dry.  Neurological:     Mental Status: She is alert and oriented to person, place, and time.  Psychiatric:        Mood and Affect: Mood and affect normal.        Behavior: Behavior normal.      UC Treatments / Results  Labs (all labs ordered are listed, but only abnormal results are displayed) Labs Reviewed  SARS-COV-2 RNA,(COVID-19) QUALITATIVE NAAT    EKG   Radiology No results found.  Procedures Procedures (including critical care time)  Medications Ordered in UC Medications - No data to display  Initial Impression / Assessment and Plan / UC Course  I have reviewed the triage vital signs and the nursing notes.  Pertinent labs & imaging results that were available during my care of the patient were reviewed by me and considered in my medical decision making (see chart for details).     No evidence of bacterial infection at this time. COVID pending Encouraged symptomatic tx F/u with PCP as needed AVS given  Final Clinical Impressions(s) / UC Diagnoses   Final diagnoses:  Viral upper  respiratory infection     Discharge Instructions      You may take 500mg  acetaminophen every 4-6 hours or in combination with ibuprofen 400-600mg  every 6-8 hours as needed for pain, inflammation, and fever.  Be sure to well hydrated with clear liquids and get at least 8 hours  of sleep at night, preferably more while sick.   Please follow up with family medicine in 1 week if needed.     ED Prescriptions    Medication Sig Dispense Auth. Provider   ipratropium (ATROVENT) 0.06 % nasal spray Place 2 sprays into both nostrils 4 (four) times daily. 15 mL Freeland Pracht O, PA-C   benzonatate (TESSALON) 100 MG capsule Take 1 capsule (100 mg total) by mouth every 8 (eight) hours. 21 capsule Noe Gens, [place]     PDMP not reviewed this encounter.   Noe Gens, [place] 05/17/20 [phone]

## 2020-05-16 NOTE — Discharge Instructions (Addendum)
  You may take 500mg acetaminophen every 4-6 hours or in combination with ibuprofen 400-600mg every 6-8 hours as needed for pain, inflammation, and fever.  Be sure to well hydrated with clear liquids and get at least 8 hours of sleep at night, preferably more while sick.   Please follow up with family medicine in 1 week if needed.   

## 2020-05-19 LAB — SARS-COV-2 RNA,(COVID-19) QUALITATIVE NAAT: SARS CoV2 RNA: DETECTED — AB

## 2020-11-19 ENCOUNTER — Other Ambulatory Visit: Payer: Self-pay | Admitting: Osteopathic Medicine

## 2020-11-19 DIAGNOSIS — F321 Major depressive disorder, single episode, moderate: Secondary | ICD-10-CM

## 2021-06-25 IMAGING — CT CT ABD-PELV W/ CM
2 of 4 series · 15 of 46 positions shown, 17 images · IV contrast (omnipaque)
Comparison: None.

CLINICAL DATA: Right lower quadrant tenderness and nausea since
11/02/2019.

EXAM:
CT ABDOMEN AND PELVIS WITH CONTRAST
TECHNIQUE: Multidetector CT imaging of the abdomen and pelvis was performed
using the standard protocol following bolus administration of
intravenous contrast.
CONTRAST:  100 mL OMNIPAQUE IOHEXOL 300 MG/ML  SOLN

[Series 2: axial st · axial · 0.83mm/px · z∈[-633,-128]mm · 12 of 111 slices shown, 14 images]
[im 5/111  soft-tissue]
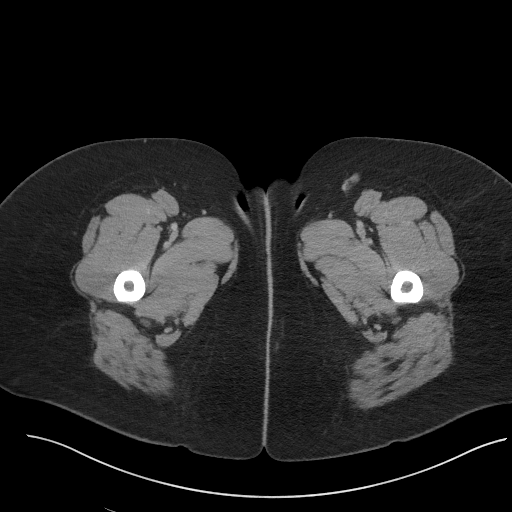
[im 5/111  bone]
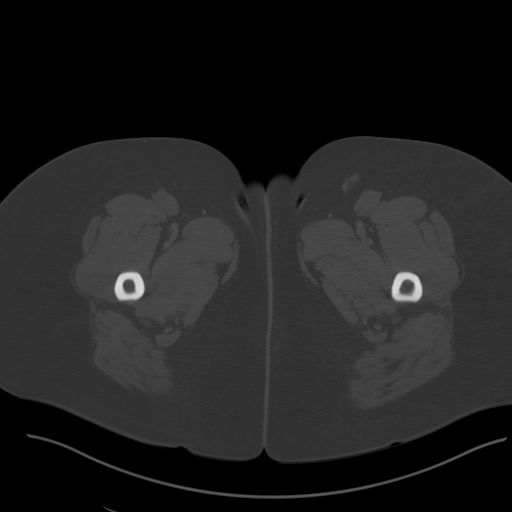
[im 14/111  soft-tissue]
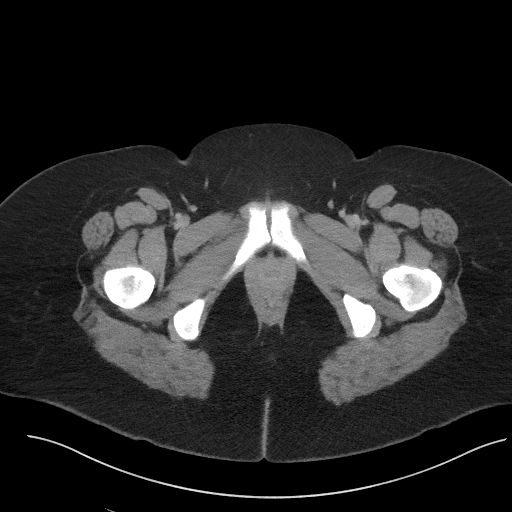
[im 23/111  soft-tissue]
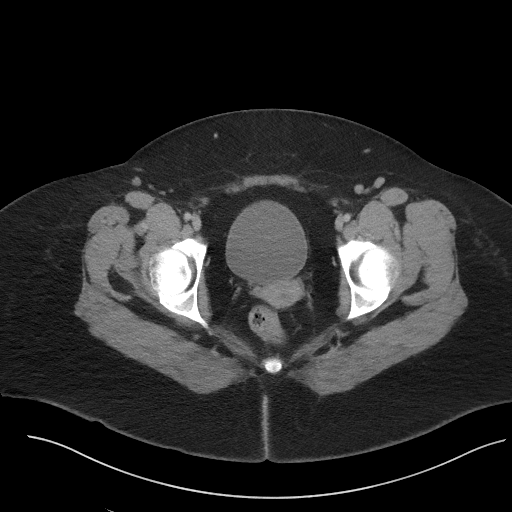
[im 33/111  soft-tissue]
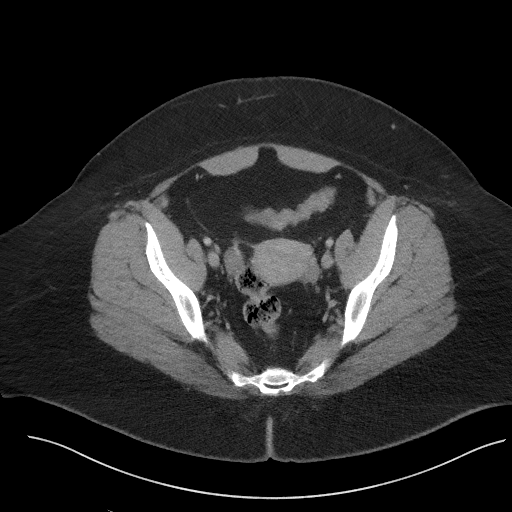
[im 42/111  soft-tissue]
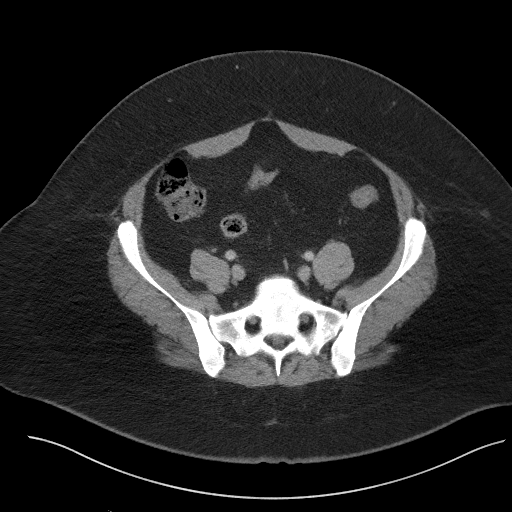
[im 51/111  soft-tissue]
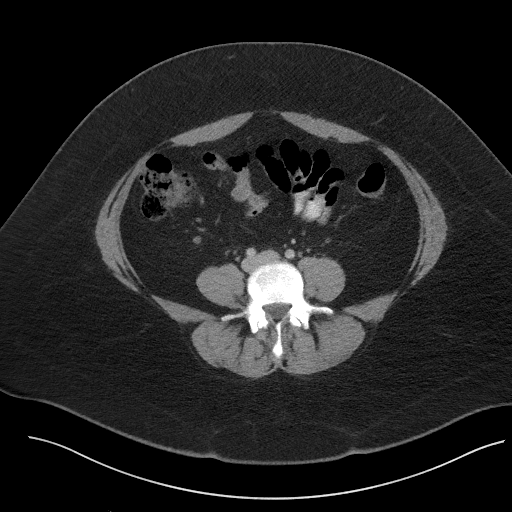
[im 60/111  soft-tissue]
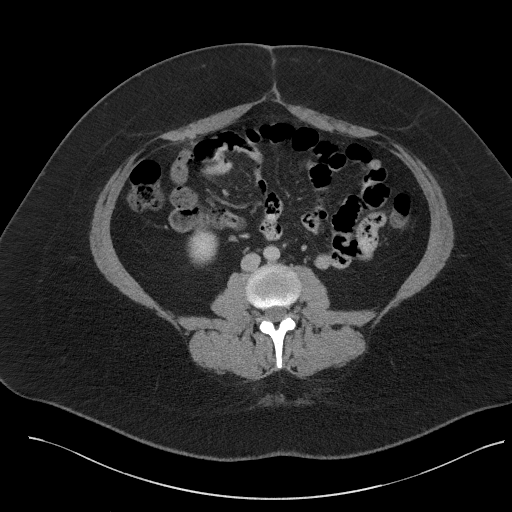
[im 69/111  soft-tissue]
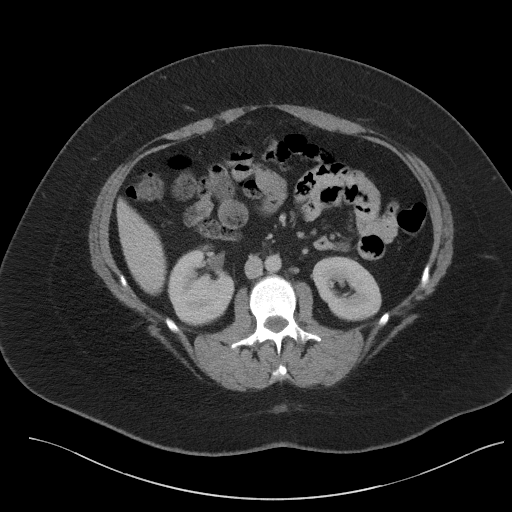
[im 78/111  soft-tissue]
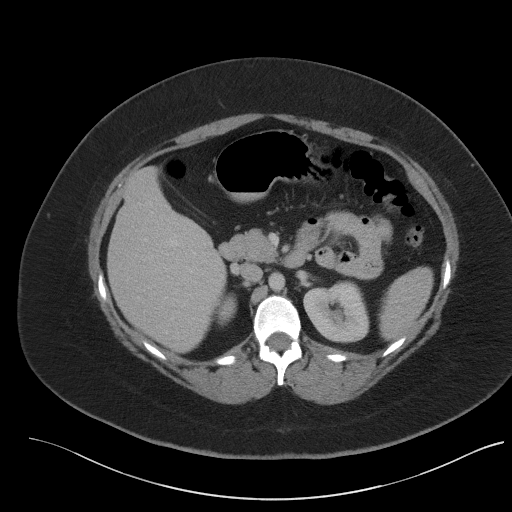
[im 78/111  bone]
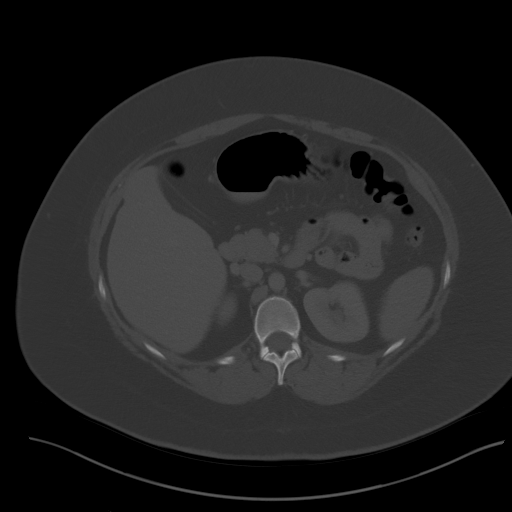
[im 88/111  soft-tissue]
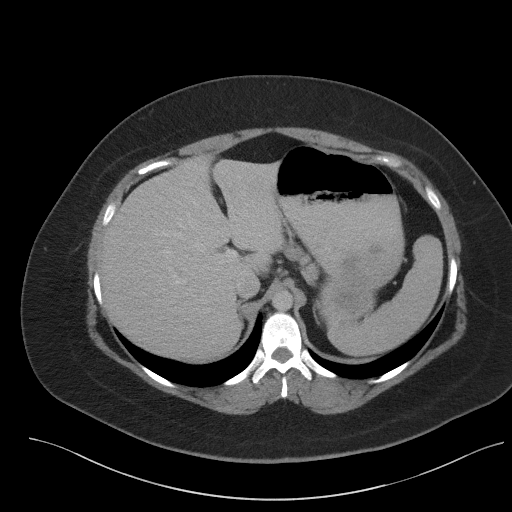
[im 97/111  soft-tissue]
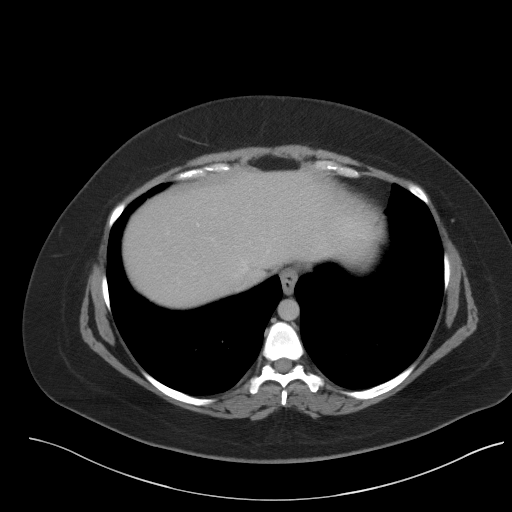
[im 106/111  soft-tissue]
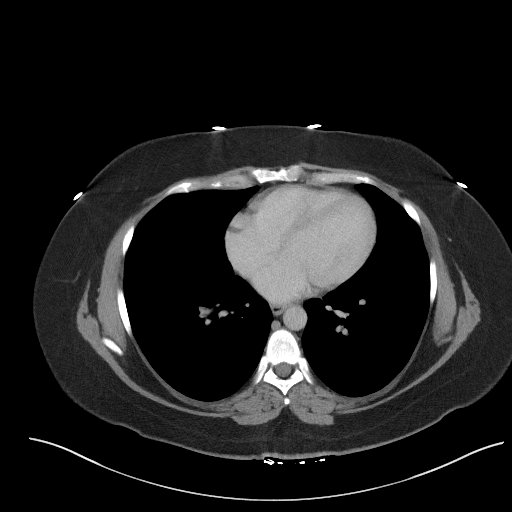

[Series 4: coronal st · coronal · 0.74mm/px · 3 of 114 slices shown]
[im 38/114  soft-tissue]
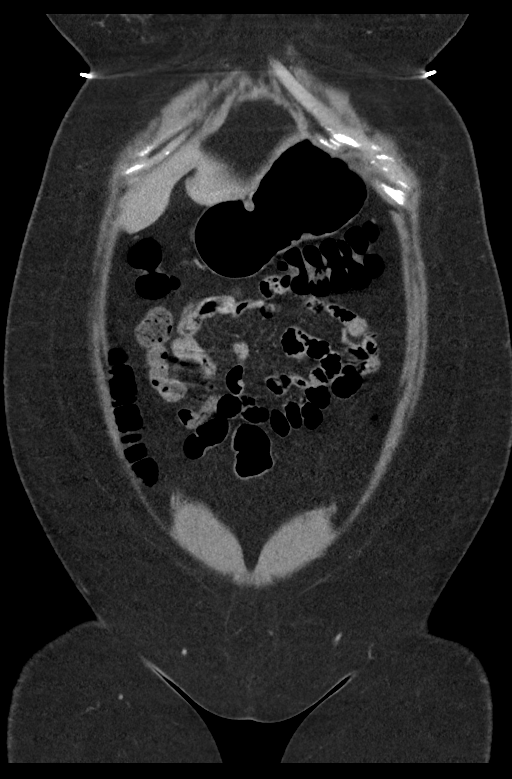
[im 51/114  soft-tissue]
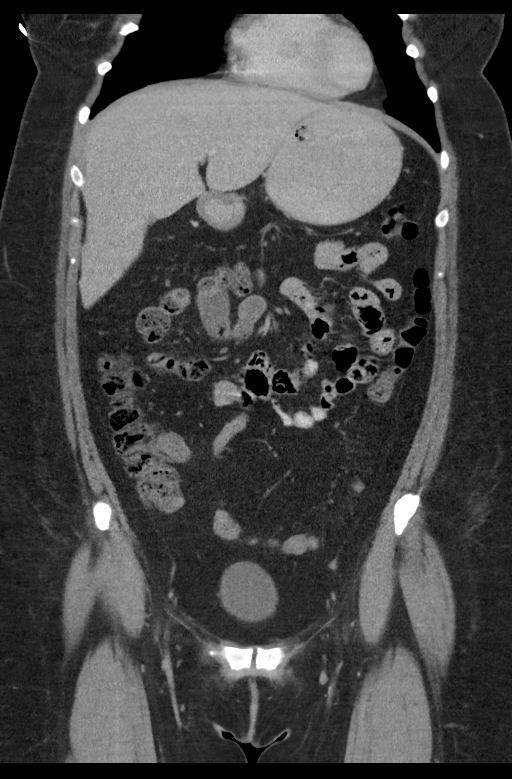
[im 63/114  soft-tissue]
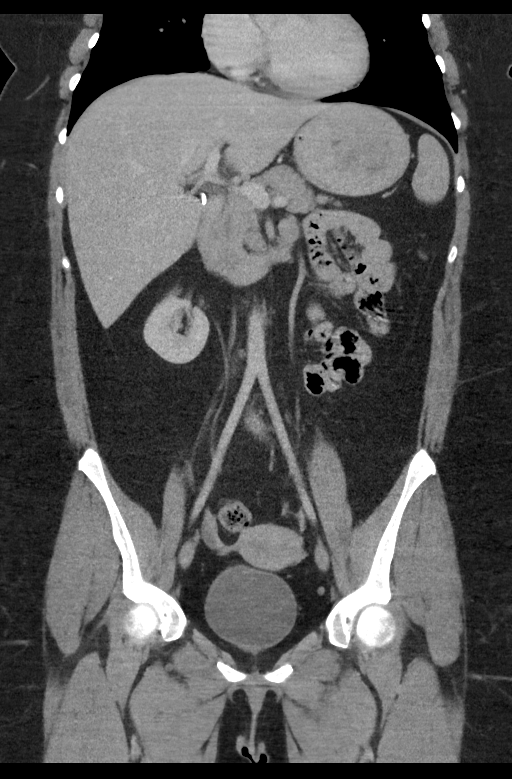

[15 of 46 positions shown; findings below may reference images not displayed]

FINDINGS: Lower chest: Lung bases clear.  No pleural or pericardial effusion.

Hepatobiliary: No focal liver abnormality is seen. Status post
cholecystectomy. No biliary dilatation. Dropped surgical clip
between the right hepatic lobe and right kidney incidentally noted.

Pancreas: Unremarkable. No pancreatic ductal dilatation or
surrounding inflammatory changes.

Spleen: Normal in size without focal abnormality.

Adrenals/Urinary Tract: Adrenal glands are unremarkable. Kidneys are
normal, without renal calculi, focal lesion, or hydronephrosis.
Bladder is unremarkable.

Stomach/Bowel: Stomach is within normal limits. Appendix appears
normal. No evidence of bowel wall thickening, distention, or
inflammatory changes.

Vascular/Lymphatic: No significant vascular findings are present. No
enlarged abdominal or pelvic lymph nodes.

Reproductive: Uterus and bilateral adnexa are unremarkable.

Other: Small fat containing umbilical hernia noted.

Musculoskeletal: Negative.
IMPRESSION: Negative for appendicitis. No acute abnormality or finding to
explain the patient's symptoms.

Small fat containing umbilical hernia.

Status post cholecystectomy.
